# Patient Record
Sex: Female | Born: 1950
Health system: Southern US, Community
[De-identification: ages and names within clinical notes are randomized; demographics above are authoritative.]

## PROBLEM LIST (undated history)

## (undated) DIAGNOSIS — T7840XA Allergy, unspecified, initial encounter: Secondary | ICD-10-CM

## (undated) DIAGNOSIS — E7800 Pure hypercholesterolemia, unspecified: Secondary | ICD-10-CM

## (undated) DIAGNOSIS — R200 Anesthesia of skin: Secondary | ICD-10-CM

## (undated) DIAGNOSIS — M4800 Spinal stenosis, site unspecified: Secondary | ICD-10-CM

## (undated) DIAGNOSIS — I10 Essential (primary) hypertension: Secondary | ICD-10-CM

## (undated) DIAGNOSIS — R32 Unspecified urinary incontinence: Secondary | ICD-10-CM

## (undated) DIAGNOSIS — K5900 Constipation, unspecified: Secondary | ICD-10-CM

## (undated) DIAGNOSIS — L509 Urticaria, unspecified: Secondary | ICD-10-CM

## (undated) HISTORY — DX: Essential (primary) hypertension: I10

## (undated) HISTORY — PX: POSTERIOR LAMINECTOMY / DECOMPRESSION LUMBAR SPINE: SUR740

## (undated) HISTORY — DX: Anesthesia of skin: R20.0

## (undated) HISTORY — DX: Spinal stenosis, site unspecified: M48.00

## (undated) HISTORY — PX: OTHER SURGICAL HISTORY: SHX169

## (undated) HISTORY — DX: Unspecified urinary incontinence: R32

## (undated) HISTORY — DX: Constipation, unspecified: K59.00

## (undated) HISTORY — DX: Urticaria, unspecified: L50.9

## (undated) HISTORY — PX: GYNECOLOGIC CRYOSURGERY: SHX857

## (undated) HISTORY — DX: Pure hypercholesterolemia, unspecified: E78.00

## (undated) HISTORY — PX: CERVICAL FUSION: SHX112

## (undated) HISTORY — DX: Allergy, unspecified, initial encounter: T78.40XA

---

## 1979-03-11 HISTORY — PX: APPENDECTOMY: SHX54

## 1979-03-11 HISTORY — PX: TUBAL LIGATION: SHX77

## 1986-02-04 HISTORY — PX: BUNIONECTOMY: SHX129

## 1987-08-31 HISTORY — PX: ULNAR NERVE TRANSPOSITION: SHX2595

## 1989-08-13 HISTORY — PX: BREAST BIOPSY: SHX20

## 1998-10-05 HISTORY — PX: BREAST BIOPSY: SHX20

## 2006-01-24 HISTORY — PX: TOE SURGERY: SHX1073

## 2009-01-06 HISTORY — PX: CERVICAL FUSION: SHX112

## 2013-01-20 HISTORY — PX: UMBILICAL HERNIA REPAIR: SHX196

## 2014-12-14 HISTORY — PX: OTHER SURGICAL HISTORY: SHX169

## 2017-03-05 DIAGNOSIS — E785 Hyperlipidemia, unspecified: Secondary | ICD-10-CM | POA: Diagnosis not present

## 2017-03-05 DIAGNOSIS — E559 Vitamin D deficiency, unspecified: Secondary | ICD-10-CM | POA: Diagnosis not present

## 2017-03-05 DIAGNOSIS — M47819 Spondylosis without myelopathy or radiculopathy, site unspecified: Secondary | ICD-10-CM | POA: Diagnosis not present

## 2017-03-05 DIAGNOSIS — Z0001 Encounter for general adult medical examination with abnormal findings: Secondary | ICD-10-CM | POA: Diagnosis not present

## 2017-03-05 DIAGNOSIS — R5383 Other fatigue: Secondary | ICD-10-CM | POA: Diagnosis not present

## 2017-03-05 DIAGNOSIS — E2839 Other primary ovarian failure: Secondary | ICD-10-CM | POA: Diagnosis not present

## 2017-03-28 DIAGNOSIS — E2839 Other primary ovarian failure: Secondary | ICD-10-CM | POA: Diagnosis not present

## 2017-03-28 DIAGNOSIS — Z13820 Encounter for screening for osteoporosis: Secondary | ICD-10-CM | POA: Diagnosis not present

## 2017-04-30 DIAGNOSIS — R030 Elevated blood-pressure reading, without diagnosis of hypertension: Secondary | ICD-10-CM | POA: Diagnosis not present

## 2017-04-30 DIAGNOSIS — R946 Abnormal results of thyroid function studies: Secondary | ICD-10-CM | POA: Diagnosis not present

## 2017-04-30 DIAGNOSIS — R42 Dizziness and giddiness: Secondary | ICD-10-CM | POA: Diagnosis not present

## 2017-05-21 DIAGNOSIS — I10 Essential (primary) hypertension: Secondary | ICD-10-CM | POA: Diagnosis not present

## 2017-05-21 DIAGNOSIS — Z79899 Other long term (current) drug therapy: Secondary | ICD-10-CM | POA: Diagnosis not present

## 2017-09-20 DIAGNOSIS — R946 Abnormal results of thyroid function studies: Secondary | ICD-10-CM | POA: Diagnosis not present

## 2017-09-20 DIAGNOSIS — E785 Hyperlipidemia, unspecified: Secondary | ICD-10-CM | POA: Diagnosis not present

## 2017-09-20 DIAGNOSIS — J019 Acute sinusitis, unspecified: Secondary | ICD-10-CM | POA: Diagnosis not present

## 2017-09-20 DIAGNOSIS — J40 Bronchitis, not specified as acute or chronic: Secondary | ICD-10-CM | POA: Diagnosis not present

## 2017-09-20 DIAGNOSIS — Z6825 Body mass index (BMI) 25.0-25.9, adult: Secondary | ICD-10-CM | POA: Diagnosis not present

## 2017-09-20 DIAGNOSIS — I10 Essential (primary) hypertension: Secondary | ICD-10-CM | POA: Diagnosis not present

## 2017-09-20 DIAGNOSIS — Z79899 Other long term (current) drug therapy: Secondary | ICD-10-CM | POA: Diagnosis not present

## 2017-11-04 DIAGNOSIS — H524 Presbyopia: Secondary | ICD-10-CM | POA: Diagnosis not present

## 2017-11-04 DIAGNOSIS — H2513 Age-related nuclear cataract, bilateral: Secondary | ICD-10-CM | POA: Diagnosis not present

## 2017-11-18 DIAGNOSIS — R42 Dizziness and giddiness: Secondary | ICD-10-CM | POA: Diagnosis not present

## 2017-11-18 DIAGNOSIS — R0982 Postnasal drip: Secondary | ICD-10-CM | POA: Diagnosis not present

## 2017-12-12 DIAGNOSIS — L509 Urticaria, unspecified: Secondary | ICD-10-CM | POA: Diagnosis not present

## 2017-12-20 DIAGNOSIS — Z1231 Encounter for screening mammogram for malignant neoplasm of breast: Secondary | ICD-10-CM | POA: Diagnosis not present

## 2017-12-30 DIAGNOSIS — D2239 Melanocytic nevi of other parts of face: Secondary | ICD-10-CM | POA: Diagnosis not present

## 2017-12-30 DIAGNOSIS — L814 Other melanin hyperpigmentation: Secondary | ICD-10-CM | POA: Diagnosis not present

## 2017-12-30 DIAGNOSIS — D225 Melanocytic nevi of trunk: Secondary | ICD-10-CM | POA: Diagnosis not present

## 2017-12-30 DIAGNOSIS — L821 Other seborrheic keratosis: Secondary | ICD-10-CM | POA: Diagnosis not present

## 2017-12-30 DIAGNOSIS — L820 Inflamed seborrheic keratosis: Secondary | ICD-10-CM | POA: Diagnosis not present

## 2018-01-20 ENCOUNTER — Encounter: Payer: Self-pay | Admitting: Allergy and Immunology

## 2018-01-20 ENCOUNTER — Ambulatory Visit (INDEPENDENT_AMBULATORY_CARE_PROVIDER_SITE_OTHER): Payer: PPO | Admitting: Allergy and Immunology

## 2018-01-20 VITALS — BP 120/86 | HR 88 | Temp 98.6°F | Resp 16 | Ht 60.5 in | Wt 134.4 lb

## 2018-01-20 DIAGNOSIS — L501 Idiopathic urticaria: Secondary | ICD-10-CM

## 2018-01-20 DIAGNOSIS — T7840XA Allergy, unspecified, initial encounter: Secondary | ICD-10-CM

## 2018-01-20 NOTE — Progress Notes (Signed)
NEW PATIENT NOTE  Referring Provider: No ref. provider found Primary Provider: Ernestene Kiel, MD Date of office visit: 01/20/2018    Subjective:   Chief Complaint:  Beverly Wolfe (DOB: 1951-04-01) is a 67 y.o. female who presents to the clinic on 01/20/2018 with a chief complaint of Urticaria .  HPI: Stashia presents to this clinic in evaluation of a reaction that occurred on 11 December 2017.  Mazelle was attending an auction in March 2019 and had exposure to tobacco smell from cigar boxes that she purchased at the auction.  Later that night she started to develop elbow and knee redness and over the course of 24 hours or so she developed urticaria on the area of her knee and her elbow.  It was intensely itchy and she did excoriate this area.  It waxed and waned over the course of several days while using topical Benadryl and oral Benadryl and then she went to visit her primary care doctor who gave her a short course of prednisone and Zyrtec and Zantac and had her use hydrocortisone.  All that issue resolved within about 6 days total duration.  It has not been recurrent since.  She had no associated systemic or constitutional symptoms with this reactivity.  There was no healing with scar or hyperpigmentation on her skin.  There was not an obvious provoking factor giving rise to this issue.  She did have vertigo in February and took meclizine but her meclizine use discontinued around mid February.  She did have exposure to an indoor dog but that exposure was at least 1 week prior to the onset of her reaction.  Otherwise, her environment has not really changed significantly.  Ryanne did have an episode of anaphylaxis with unknown etiologic agent back in 2012 that fortunately has not been recurrent.  She did have an EpiPen back at that point in time but does not carry an EpiPen now.  She sometimes does have an issue with nasal itchiness and sneezing when getting around dog but no other significant  atopic symptomatology.  Past Medical History:  Diagnosis Date  . Allergic reaction   . High blood pressure   . Urticaria     Past Surgical History:  Procedure Laterality Date  . APPENDECTOMY  03/11/1979  . BREAST BIOPSY Left 08/13/1989  . BREAST BIOPSY Left 10/05/1998  . BUNIONECTOMY  02/04/1986  . CERVICAL FUSION  01/2009  . OTHER SURGICAL HISTORY  12/14/2014   Right arm radial nerve release  . TOE SURGERY Right 01/24/2006  . TUBAL LIGATION  03/11/1979  . ULNAR NERVE TRANSPOSITION Left 08/31/1987  . UMBILICAL HERNIA REPAIR  01/20/2013    Allergies as of 01/20/2018      Reactions   Cymbalta [duloxetine Hcl] Nausea Only, Other (See Comments)   Shakiness   Percocet [oxycodone-acetaminophen] Itching   Talwin [pentazocine] Nausea And Vomiting, Other (See Comments)   Hallucinations   Bactrim [sulfamethoxazole-trimethoprim] Itching, Rash   Sulfa Antibiotics Itching, Rash      Medication List      celecoxib 200 MG capsule Commonly known as:  CELEBREX Take 200 mg by mouth 2 (two) times daily.   gabapentin 300 MG capsule Commonly known as:  NEURONTIN Take 300 mg by mouth 2 (two) times daily.   GLUCOSAMINE-CHONDROITIN PO Take by mouth 2 (two) times daily.   HYDROCHLOROTHIAZIDE PO Take 12.5 mg by mouth 2 (two) times daily.   Magnesium 250 MG Tabs Take by mouth 2 (two) times daily.  omega-3 acid ethyl esters 1 g capsule Commonly known as:  LOVAZA   Vitamin D3 1000 units Caps Take by mouth daily.       Review of systems negative except as noted in HPI / PMHx or noted below:  Review of Systems  Constitutional: Negative.   HENT: Negative.   Eyes: Negative.   Respiratory: Negative.   Cardiovascular: Negative.   Gastrointestinal: Negative.   Genitourinary: Negative.   Musculoskeletal: Negative.   Skin: Negative.   Neurological: Negative.   Endo/Heme/Allergies: Negative.   Psychiatric/Behavioral: Negative.     Family History  Problem Relation Age of  Onset  . Allergic rhinitis Mother   . Cancer - Other Father        Oral Cancer  . COPD Sister   . Diabetes Maternal Aunt   . Diabetes Maternal Grandmother     Social History   Socioeconomic History  . Marital status: Single    Spouse name: Not on file  . Number of children: Not on file  . Years of education: Not on file  . Highest education level: Not on file  Occupational History  . Not on file  Social Needs  . Financial resource strain: Not on file  . Food insecurity:    Worry: Not on file    Inability: Not on file  . Transportation needs:    Medical: Not on file    Non-medical: Not on file  Tobacco Use  . Smoking status: Never Smoker  . Smokeless tobacco: Never Used  Substance and Sexual Activity  . Alcohol use: Yes  . Drug use: Never  . Sexual activity: Not on file  Lifestyle  . Physical activity:    Days per week: Not on file    Minutes per session: Not on file  . Stress: Not on file  Relationships  . Social connections:    Talks on phone: Not on file    Gets together: Not on file    Attends religious service: Not on file    Active member of club or organization: Not on file    Attends meetings of clubs or organizations: Not on file    Relationship status: Not on file  . Intimate partner violence:    Fear of current or ex partner: Not on file    Emotionally abused: Not on file    Physically abused: Not on file    Forced sexual activity: Not on file  Other Topics Concern  . Not on file  Social History Narrative  . Not on file    Environmental and Social history  Lives in a house with a dry environment, no animals located inside the household, cats and dogs located outside the household, hardwood in the bedroom floor, no plastic on the bed, no plastic on the pillow, no smokers located inside the household.  Objective:   Vitals:   01/20/18 1023  BP: 120/86  Pulse: 88  Resp: 16  Temp: 98.6 F (37 C)   Height: 5' 0.5" (153.7 cm) Weight: 134 lb  6.4 oz (61 kg)  Physical Exam  HENT:  Head: Normocephalic. Head is without right periorbital erythema and without left periorbital erythema.  Right Ear: Tympanic membrane, external ear and ear canal normal.  Left Ear: Tympanic membrane, external ear and ear canal normal.  Nose: Nose normal. No mucosal edema or rhinorrhea.  Mouth/Throat: Uvula is midline, oropharynx is clear and moist and mucous membranes are normal. No oropharyngeal exudate.  Eyes: Pupils are equal, round,  and reactive to light. Conjunctivae and lids are normal.  Neck: Trachea normal. No tracheal tenderness present. No tracheal deviation present. No thyromegaly present.  Cardiovascular: Normal rate, regular rhythm, S1 normal, S2 normal and normal heart sounds.  No murmur heard. Pulmonary/Chest: Effort normal and breath sounds normal. No stridor. No respiratory distress. She has no wheezes. She has no rales. She exhibits no tenderness.  Abdominal: Soft. She exhibits no distension and no mass. There is no hepatosplenomegaly. There is no tenderness. There is no rebound and no guarding.  Musculoskeletal: She exhibits no edema or tenderness.  Lymphadenopathy:       Head (right side): No tonsillar adenopathy present.       Head (left side): No tonsillar adenopathy present.    She has no cervical adenopathy.    She has no axillary adenopathy.  Neurological: She is alert.  Skin: Rash (Very slight erythema of posterior elbow.) noted. She is not diaphoretic. No erythema. No pallor. Nails show no clubbing.    Diagnostics: none   Assessment and Plan:    1. Allergic reaction, initial encounter   2. Idiopathic urticaria    Daesha had an isolated episode of immunological hyperreactivity with unknown etiologic factor that fortunately has resolved.  I have informed Briana that she should contact me should she have recurrent reactions in the future.  At this point we will hold off on any evaluation for the etiologic agent contributing to  her episode of immunological hyperreactivity as it does appear to have completely resolved.  If she has recurrent episodes in the future we will need to have her undergo further evaluation looking for the etiologic agent responsible for these episodes of immunological hyperreactivity.  Allena Katz, MD Allergy / Immunology [place]

## 2018-01-21 ENCOUNTER — Encounter: Payer: Self-pay | Admitting: Allergy and Immunology

## 2018-03-21 DIAGNOSIS — E785 Hyperlipidemia, unspecified: Secondary | ICD-10-CM | POA: Diagnosis not present

## 2018-03-21 DIAGNOSIS — R42 Dizziness and giddiness: Secondary | ICD-10-CM | POA: Diagnosis not present

## 2018-03-21 DIAGNOSIS — I10 Essential (primary) hypertension: Secondary | ICD-10-CM | POA: Diagnosis not present

## 2018-03-21 DIAGNOSIS — Z79899 Other long term (current) drug therapy: Secondary | ICD-10-CM | POA: Diagnosis not present

## 2018-03-21 DIAGNOSIS — M5030 Other cervical disc degeneration, unspecified cervical region: Secondary | ICD-10-CM | POA: Diagnosis not present

## 2018-03-21 DIAGNOSIS — R946 Abnormal results of thyroid function studies: Secondary | ICD-10-CM | POA: Diagnosis not present

## 2018-05-07 DIAGNOSIS — M1712 Unilateral primary osteoarthritis, left knee: Secondary | ICD-10-CM | POA: Diagnosis not present

## 2018-05-07 DIAGNOSIS — M25562 Pain in left knee: Secondary | ICD-10-CM | POA: Diagnosis not present

## 2018-05-28 DIAGNOSIS — L820 Inflamed seborrheic keratosis: Secondary | ICD-10-CM | POA: Diagnosis not present

## 2018-05-28 DIAGNOSIS — L814 Other melanin hyperpigmentation: Secondary | ICD-10-CM | POA: Diagnosis not present

## 2018-09-02 DIAGNOSIS — L0100 Impetigo, unspecified: Secondary | ICD-10-CM | POA: Diagnosis not present

## 2018-09-05 DIAGNOSIS — B029 Zoster without complications: Secondary | ICD-10-CM | POA: Diagnosis not present

## 2018-09-08 DIAGNOSIS — B029 Zoster without complications: Secondary | ICD-10-CM | POA: Diagnosis not present

## 2018-09-22 DIAGNOSIS — I10 Essential (primary) hypertension: Secondary | ICD-10-CM | POA: Diagnosis not present

## 2018-09-22 DIAGNOSIS — Z0000 Encounter for general adult medical examination without abnormal findings: Secondary | ICD-10-CM | POA: Diagnosis not present

## 2018-09-22 DIAGNOSIS — Z23 Encounter for immunization: Secondary | ICD-10-CM | POA: Diagnosis not present

## 2018-09-22 DIAGNOSIS — Z1331 Encounter for screening for depression: Secondary | ICD-10-CM | POA: Diagnosis not present

## 2018-09-22 DIAGNOSIS — Z79899 Other long term (current) drug therapy: Secondary | ICD-10-CM | POA: Diagnosis not present

## 2018-09-22 DIAGNOSIS — E785 Hyperlipidemia, unspecified: Secondary | ICD-10-CM | POA: Diagnosis not present

## 2018-09-22 DIAGNOSIS — Z6825 Body mass index (BMI) 25.0-25.9, adult: Secondary | ICD-10-CM | POA: Diagnosis not present

## 2018-09-22 DIAGNOSIS — Z1231 Encounter for screening mammogram for malignant neoplasm of breast: Secondary | ICD-10-CM | POA: Diagnosis not present

## 2018-09-22 DIAGNOSIS — Z1339 Encounter for screening examination for other mental health and behavioral disorders: Secondary | ICD-10-CM | POA: Diagnosis not present

## 2018-10-07 DIAGNOSIS — R200 Anesthesia of skin: Secondary | ICD-10-CM | POA: Diagnosis not present

## 2018-10-07 DIAGNOSIS — Z6826 Body mass index (BMI) 26.0-26.9, adult: Secondary | ICD-10-CM | POA: Diagnosis not present

## 2018-10-07 DIAGNOSIS — E663 Overweight: Secondary | ICD-10-CM | POA: Diagnosis not present

## 2018-10-24 DIAGNOSIS — I6782 Cerebral ischemia: Secondary | ICD-10-CM | POA: Diagnosis not present

## 2018-10-24 DIAGNOSIS — R42 Dizziness and giddiness: Secondary | ICD-10-CM | POA: Diagnosis not present

## 2018-10-24 DIAGNOSIS — R200 Anesthesia of skin: Secondary | ICD-10-CM | POA: Diagnosis not present

## 2018-11-27 DIAGNOSIS — Z6825 Body mass index (BMI) 25.0-25.9, adult: Secondary | ICD-10-CM | POA: Diagnosis not present

## 2018-11-27 DIAGNOSIS — R209 Unspecified disturbances of skin sensation: Secondary | ICD-10-CM | POA: Diagnosis not present

## 2018-11-27 DIAGNOSIS — I679 Cerebrovascular disease, unspecified: Secondary | ICD-10-CM | POA: Diagnosis not present

## 2018-11-27 DIAGNOSIS — M5030 Other cervical disc degeneration, unspecified cervical region: Secondary | ICD-10-CM | POA: Diagnosis not present

## 2018-12-01 DIAGNOSIS — H05231 Hemorrhage of right orbit: Secondary | ICD-10-CM | POA: Diagnosis not present

## 2019-03-04 DIAGNOSIS — Z79899 Other long term (current) drug therapy: Secondary | ICD-10-CM | POA: Diagnosis not present

## 2019-03-04 DIAGNOSIS — G8929 Other chronic pain: Secondary | ICD-10-CM | POA: Diagnosis not present

## 2019-03-04 DIAGNOSIS — M25511 Pain in right shoulder: Secondary | ICD-10-CM | POA: Diagnosis not present

## 2019-03-04 DIAGNOSIS — R29898 Other symptoms and signs involving the musculoskeletal system: Secondary | ICD-10-CM | POA: Diagnosis not present

## 2019-03-04 DIAGNOSIS — M25512 Pain in left shoulder: Secondary | ICD-10-CM | POA: Diagnosis not present

## 2019-03-04 DIAGNOSIS — Z6836 Body mass index (BMI) 36.0-36.9, adult: Secondary | ICD-10-CM | POA: Diagnosis not present

## 2019-03-09 DIAGNOSIS — M5021 Other cervical disc displacement,  high cervical region: Secondary | ICD-10-CM | POA: Diagnosis not present

## 2019-03-09 DIAGNOSIS — M5030 Other cervical disc degeneration, unspecified cervical region: Secondary | ICD-10-CM | POA: Diagnosis not present

## 2019-03-09 DIAGNOSIS — M4803 Spinal stenosis, cervicothoracic region: Secondary | ICD-10-CM | POA: Diagnosis not present

## 2019-03-09 DIAGNOSIS — M47812 Spondylosis without myelopathy or radiculopathy, cervical region: Secondary | ICD-10-CM | POA: Diagnosis not present

## 2019-03-09 DIAGNOSIS — M4802 Spinal stenosis, cervical region: Secondary | ICD-10-CM | POA: Diagnosis not present

## 2019-03-16 DIAGNOSIS — M9901 Segmental and somatic dysfunction of cervical region: Secondary | ICD-10-CM | POA: Diagnosis not present

## 2019-03-16 DIAGNOSIS — M546 Pain in thoracic spine: Secondary | ICD-10-CM | POA: Diagnosis not present

## 2019-03-16 DIAGNOSIS — M4723 Other spondylosis with radiculopathy, cervicothoracic region: Secondary | ICD-10-CM | POA: Diagnosis not present

## 2019-03-16 DIAGNOSIS — M9903 Segmental and somatic dysfunction of lumbar region: Secondary | ICD-10-CM | POA: Diagnosis not present

## 2019-03-16 DIAGNOSIS — M9902 Segmental and somatic dysfunction of thoracic region: Secondary | ICD-10-CM | POA: Diagnosis not present

## 2019-03-18 DIAGNOSIS — M4723 Other spondylosis with radiculopathy, cervicothoracic region: Secondary | ICD-10-CM | POA: Diagnosis not present

## 2019-03-18 DIAGNOSIS — M546 Pain in thoracic spine: Secondary | ICD-10-CM | POA: Diagnosis not present

## 2019-03-18 DIAGNOSIS — M9902 Segmental and somatic dysfunction of thoracic region: Secondary | ICD-10-CM | POA: Diagnosis not present

## 2019-03-18 DIAGNOSIS — M9903 Segmental and somatic dysfunction of lumbar region: Secondary | ICD-10-CM | POA: Diagnosis not present

## 2019-03-18 DIAGNOSIS — M9901 Segmental and somatic dysfunction of cervical region: Secondary | ICD-10-CM | POA: Diagnosis not present

## 2019-03-23 DIAGNOSIS — M9902 Segmental and somatic dysfunction of thoracic region: Secondary | ICD-10-CM | POA: Diagnosis not present

## 2019-03-23 DIAGNOSIS — M546 Pain in thoracic spine: Secondary | ICD-10-CM | POA: Diagnosis not present

## 2019-03-23 DIAGNOSIS — M9901 Segmental and somatic dysfunction of cervical region: Secondary | ICD-10-CM | POA: Diagnosis not present

## 2019-03-23 DIAGNOSIS — M4723 Other spondylosis with radiculopathy, cervicothoracic region: Secondary | ICD-10-CM | POA: Diagnosis not present

## 2019-03-23 DIAGNOSIS — M9903 Segmental and somatic dysfunction of lumbar region: Secondary | ICD-10-CM | POA: Diagnosis not present

## 2019-03-25 DIAGNOSIS — M4723 Other spondylosis with radiculopathy, cervicothoracic region: Secondary | ICD-10-CM | POA: Diagnosis not present

## 2019-03-25 DIAGNOSIS — M546 Pain in thoracic spine: Secondary | ICD-10-CM | POA: Diagnosis not present

## 2019-03-25 DIAGNOSIS — M9901 Segmental and somatic dysfunction of cervical region: Secondary | ICD-10-CM | POA: Diagnosis not present

## 2019-03-25 DIAGNOSIS — M9903 Segmental and somatic dysfunction of lumbar region: Secondary | ICD-10-CM | POA: Diagnosis not present

## 2019-03-25 DIAGNOSIS — M9902 Segmental and somatic dysfunction of thoracic region: Secondary | ICD-10-CM | POA: Diagnosis not present

## 2019-03-30 DIAGNOSIS — Z1231 Encounter for screening mammogram for malignant neoplasm of breast: Secondary | ICD-10-CM | POA: Diagnosis not present

## 2019-03-30 DIAGNOSIS — I10 Essential (primary) hypertension: Secondary | ICD-10-CM | POA: Diagnosis not present

## 2019-03-30 DIAGNOSIS — M9902 Segmental and somatic dysfunction of thoracic region: Secondary | ICD-10-CM | POA: Diagnosis not present

## 2019-03-30 DIAGNOSIS — Z124 Encounter for screening for malignant neoplasm of cervix: Secondary | ICD-10-CM | POA: Diagnosis not present

## 2019-03-30 DIAGNOSIS — E785 Hyperlipidemia, unspecified: Secondary | ICD-10-CM | POA: Diagnosis not present

## 2019-03-30 DIAGNOSIS — M5030 Other cervical disc degeneration, unspecified cervical region: Secondary | ICD-10-CM | POA: Diagnosis not present

## 2019-03-30 DIAGNOSIS — M546 Pain in thoracic spine: Secondary | ICD-10-CM | POA: Diagnosis not present

## 2019-03-30 DIAGNOSIS — M9903 Segmental and somatic dysfunction of lumbar region: Secondary | ICD-10-CM | POA: Diagnosis not present

## 2019-03-30 DIAGNOSIS — Z1331 Encounter for screening for depression: Secondary | ICD-10-CM | POA: Diagnosis not present

## 2019-03-30 DIAGNOSIS — M9901 Segmental and somatic dysfunction of cervical region: Secondary | ICD-10-CM | POA: Diagnosis not present

## 2019-03-30 DIAGNOSIS — Z0001 Encounter for general adult medical examination with abnormal findings: Secondary | ICD-10-CM | POA: Diagnosis not present

## 2019-03-30 DIAGNOSIS — M4723 Other spondylosis with radiculopathy, cervicothoracic region: Secondary | ICD-10-CM | POA: Diagnosis not present

## 2019-03-30 DIAGNOSIS — Z79899 Other long term (current) drug therapy: Secondary | ICD-10-CM | POA: Diagnosis not present

## 2019-03-30 DIAGNOSIS — Z6825 Body mass index (BMI) 25.0-25.9, adult: Secondary | ICD-10-CM | POA: Diagnosis not present

## 2019-03-30 DIAGNOSIS — I679 Cerebrovascular disease, unspecified: Secondary | ICD-10-CM | POA: Diagnosis not present

## 2019-03-30 DIAGNOSIS — Z79891 Long term (current) use of opiate analgesic: Secondary | ICD-10-CM | POA: Diagnosis not present

## 2019-04-02 DIAGNOSIS — M9903 Segmental and somatic dysfunction of lumbar region: Secondary | ICD-10-CM | POA: Diagnosis not present

## 2019-04-02 DIAGNOSIS — M546 Pain in thoracic spine: Secondary | ICD-10-CM | POA: Diagnosis not present

## 2019-04-02 DIAGNOSIS — M9902 Segmental and somatic dysfunction of thoracic region: Secondary | ICD-10-CM | POA: Diagnosis not present

## 2019-04-02 DIAGNOSIS — M4723 Other spondylosis with radiculopathy, cervicothoracic region: Secondary | ICD-10-CM | POA: Diagnosis not present

## 2019-04-02 DIAGNOSIS — M9901 Segmental and somatic dysfunction of cervical region: Secondary | ICD-10-CM | POA: Diagnosis not present

## 2019-04-06 DIAGNOSIS — M9902 Segmental and somatic dysfunction of thoracic region: Secondary | ICD-10-CM | POA: Diagnosis not present

## 2019-04-06 DIAGNOSIS — M9901 Segmental and somatic dysfunction of cervical region: Secondary | ICD-10-CM | POA: Diagnosis not present

## 2019-04-06 DIAGNOSIS — M9903 Segmental and somatic dysfunction of lumbar region: Secondary | ICD-10-CM | POA: Diagnosis not present

## 2019-04-06 DIAGNOSIS — M546 Pain in thoracic spine: Secondary | ICD-10-CM | POA: Diagnosis not present

## 2019-04-06 DIAGNOSIS — M4723 Other spondylosis with radiculopathy, cervicothoracic region: Secondary | ICD-10-CM | POA: Diagnosis not present

## 2019-04-09 DIAGNOSIS — M546 Pain in thoracic spine: Secondary | ICD-10-CM | POA: Diagnosis not present

## 2019-04-09 DIAGNOSIS — M9902 Segmental and somatic dysfunction of thoracic region: Secondary | ICD-10-CM | POA: Diagnosis not present

## 2019-04-09 DIAGNOSIS — M9901 Segmental and somatic dysfunction of cervical region: Secondary | ICD-10-CM | POA: Diagnosis not present

## 2019-04-09 DIAGNOSIS — M4723 Other spondylosis with radiculopathy, cervicothoracic region: Secondary | ICD-10-CM | POA: Diagnosis not present

## 2019-04-09 DIAGNOSIS — M9903 Segmental and somatic dysfunction of lumbar region: Secondary | ICD-10-CM | POA: Diagnosis not present

## 2019-04-14 DIAGNOSIS — M546 Pain in thoracic spine: Secondary | ICD-10-CM | POA: Diagnosis not present

## 2019-04-14 DIAGNOSIS — M9903 Segmental and somatic dysfunction of lumbar region: Secondary | ICD-10-CM | POA: Diagnosis not present

## 2019-04-14 DIAGNOSIS — M9902 Segmental and somatic dysfunction of thoracic region: Secondary | ICD-10-CM | POA: Diagnosis not present

## 2019-04-14 DIAGNOSIS — M4723 Other spondylosis with radiculopathy, cervicothoracic region: Secondary | ICD-10-CM | POA: Diagnosis not present

## 2019-04-14 DIAGNOSIS — M9901 Segmental and somatic dysfunction of cervical region: Secondary | ICD-10-CM | POA: Diagnosis not present

## 2019-04-16 DIAGNOSIS — M5412 Radiculopathy, cervical region: Secondary | ICD-10-CM | POA: Diagnosis not present

## 2019-04-21 DIAGNOSIS — M9901 Segmental and somatic dysfunction of cervical region: Secondary | ICD-10-CM | POA: Diagnosis not present

## 2019-04-21 DIAGNOSIS — M546 Pain in thoracic spine: Secondary | ICD-10-CM | POA: Diagnosis not present

## 2019-04-21 DIAGNOSIS — M4723 Other spondylosis with radiculopathy, cervicothoracic region: Secondary | ICD-10-CM | POA: Diagnosis not present

## 2019-04-21 DIAGNOSIS — M9902 Segmental and somatic dysfunction of thoracic region: Secondary | ICD-10-CM | POA: Diagnosis not present

## 2019-04-21 DIAGNOSIS — M9903 Segmental and somatic dysfunction of lumbar region: Secondary | ICD-10-CM | POA: Diagnosis not present

## 2019-04-28 DIAGNOSIS — M9901 Segmental and somatic dysfunction of cervical region: Secondary | ICD-10-CM | POA: Diagnosis not present

## 2019-04-28 DIAGNOSIS — M546 Pain in thoracic spine: Secondary | ICD-10-CM | POA: Diagnosis not present

## 2019-04-28 DIAGNOSIS — M9902 Segmental and somatic dysfunction of thoracic region: Secondary | ICD-10-CM | POA: Diagnosis not present

## 2019-04-28 DIAGNOSIS — M9903 Segmental and somatic dysfunction of lumbar region: Secondary | ICD-10-CM | POA: Diagnosis not present

## 2019-04-28 DIAGNOSIS — M4723 Other spondylosis with radiculopathy, cervicothoracic region: Secondary | ICD-10-CM | POA: Diagnosis not present

## 2019-05-01 DIAGNOSIS — M5412 Radiculopathy, cervical region: Secondary | ICD-10-CM | POA: Diagnosis not present

## 2019-05-01 DIAGNOSIS — M5013 Cervical disc disorder with radiculopathy, cervicothoracic region: Secondary | ICD-10-CM | POA: Diagnosis not present

## 2019-05-06 DIAGNOSIS — M9902 Segmental and somatic dysfunction of thoracic region: Secondary | ICD-10-CM | POA: Diagnosis not present

## 2019-05-06 DIAGNOSIS — M4723 Other spondylosis with radiculopathy, cervicothoracic region: Secondary | ICD-10-CM | POA: Diagnosis not present

## 2019-05-06 DIAGNOSIS — M9903 Segmental and somatic dysfunction of lumbar region: Secondary | ICD-10-CM | POA: Diagnosis not present

## 2019-05-06 DIAGNOSIS — M546 Pain in thoracic spine: Secondary | ICD-10-CM | POA: Diagnosis not present

## 2019-05-06 DIAGNOSIS — M9901 Segmental and somatic dysfunction of cervical region: Secondary | ICD-10-CM | POA: Diagnosis not present

## 2019-05-13 DIAGNOSIS — M546 Pain in thoracic spine: Secondary | ICD-10-CM | POA: Diagnosis not present

## 2019-05-13 DIAGNOSIS — M9902 Segmental and somatic dysfunction of thoracic region: Secondary | ICD-10-CM | POA: Diagnosis not present

## 2019-05-13 DIAGNOSIS — M9903 Segmental and somatic dysfunction of lumbar region: Secondary | ICD-10-CM | POA: Diagnosis not present

## 2019-05-13 DIAGNOSIS — M9901 Segmental and somatic dysfunction of cervical region: Secondary | ICD-10-CM | POA: Diagnosis not present

## 2019-05-13 DIAGNOSIS — M4723 Other spondylosis with radiculopathy, cervicothoracic region: Secondary | ICD-10-CM | POA: Diagnosis not present

## 2019-05-19 DIAGNOSIS — M4723 Other spondylosis with radiculopathy, cervicothoracic region: Secondary | ICD-10-CM | POA: Diagnosis not present

## 2019-05-19 DIAGNOSIS — M9901 Segmental and somatic dysfunction of cervical region: Secondary | ICD-10-CM | POA: Diagnosis not present

## 2019-05-19 DIAGNOSIS — M546 Pain in thoracic spine: Secondary | ICD-10-CM | POA: Diagnosis not present

## 2019-05-19 DIAGNOSIS — M9902 Segmental and somatic dysfunction of thoracic region: Secondary | ICD-10-CM | POA: Diagnosis not present

## 2019-05-19 DIAGNOSIS — M9903 Segmental and somatic dysfunction of lumbar region: Secondary | ICD-10-CM | POA: Diagnosis not present

## 2019-05-26 DIAGNOSIS — M4723 Other spondylosis with radiculopathy, cervicothoracic region: Secondary | ICD-10-CM | POA: Diagnosis not present

## 2019-05-26 DIAGNOSIS — M9903 Segmental and somatic dysfunction of lumbar region: Secondary | ICD-10-CM | POA: Diagnosis not present

## 2019-05-26 DIAGNOSIS — M9902 Segmental and somatic dysfunction of thoracic region: Secondary | ICD-10-CM | POA: Diagnosis not present

## 2019-05-26 DIAGNOSIS — M9901 Segmental and somatic dysfunction of cervical region: Secondary | ICD-10-CM | POA: Diagnosis not present

## 2019-05-26 DIAGNOSIS — M546 Pain in thoracic spine: Secondary | ICD-10-CM | POA: Diagnosis not present

## 2019-05-29 DIAGNOSIS — M5412 Radiculopathy, cervical region: Secondary | ICD-10-CM | POA: Diagnosis not present

## 2019-06-02 DIAGNOSIS — M9901 Segmental and somatic dysfunction of cervical region: Secondary | ICD-10-CM | POA: Diagnosis not present

## 2019-06-02 DIAGNOSIS — M9902 Segmental and somatic dysfunction of thoracic region: Secondary | ICD-10-CM | POA: Diagnosis not present

## 2019-06-02 DIAGNOSIS — M4723 Other spondylosis with radiculopathy, cervicothoracic region: Secondary | ICD-10-CM | POA: Diagnosis not present

## 2019-06-02 DIAGNOSIS — M9903 Segmental and somatic dysfunction of lumbar region: Secondary | ICD-10-CM | POA: Diagnosis not present

## 2019-06-02 DIAGNOSIS — M546 Pain in thoracic spine: Secondary | ICD-10-CM | POA: Diagnosis not present

## 2019-06-09 DIAGNOSIS — M9903 Segmental and somatic dysfunction of lumbar region: Secondary | ICD-10-CM | POA: Diagnosis not present

## 2019-06-09 DIAGNOSIS — M4723 Other spondylosis with radiculopathy, cervicothoracic region: Secondary | ICD-10-CM | POA: Diagnosis not present

## 2019-06-09 DIAGNOSIS — M546 Pain in thoracic spine: Secondary | ICD-10-CM | POA: Diagnosis not present

## 2019-06-09 DIAGNOSIS — M9902 Segmental and somatic dysfunction of thoracic region: Secondary | ICD-10-CM | POA: Diagnosis not present

## 2019-06-09 DIAGNOSIS — M9901 Segmental and somatic dysfunction of cervical region: Secondary | ICD-10-CM | POA: Diagnosis not present

## 2019-06-16 DIAGNOSIS — M9903 Segmental and somatic dysfunction of lumbar region: Secondary | ICD-10-CM | POA: Diagnosis not present

## 2019-06-16 DIAGNOSIS — M9901 Segmental and somatic dysfunction of cervical region: Secondary | ICD-10-CM | POA: Diagnosis not present

## 2019-06-16 DIAGNOSIS — M546 Pain in thoracic spine: Secondary | ICD-10-CM | POA: Diagnosis not present

## 2019-06-16 DIAGNOSIS — M9902 Segmental and somatic dysfunction of thoracic region: Secondary | ICD-10-CM | POA: Diagnosis not present

## 2019-06-16 DIAGNOSIS — M4723 Other spondylosis with radiculopathy, cervicothoracic region: Secondary | ICD-10-CM | POA: Diagnosis not present

## 2019-06-23 DIAGNOSIS — M9901 Segmental and somatic dysfunction of cervical region: Secondary | ICD-10-CM | POA: Diagnosis not present

## 2019-06-23 DIAGNOSIS — M9902 Segmental and somatic dysfunction of thoracic region: Secondary | ICD-10-CM | POA: Diagnosis not present

## 2019-06-23 DIAGNOSIS — M4723 Other spondylosis with radiculopathy, cervicothoracic region: Secondary | ICD-10-CM | POA: Diagnosis not present

## 2019-06-23 DIAGNOSIS — M9903 Segmental and somatic dysfunction of lumbar region: Secondary | ICD-10-CM | POA: Diagnosis not present

## 2019-06-23 DIAGNOSIS — M546 Pain in thoracic spine: Secondary | ICD-10-CM | POA: Diagnosis not present

## 2019-06-29 DIAGNOSIS — M5030 Other cervical disc degeneration, unspecified cervical region: Secondary | ICD-10-CM | POA: Diagnosis not present

## 2019-06-29 DIAGNOSIS — Z6825 Body mass index (BMI) 25.0-25.9, adult: Secondary | ICD-10-CM | POA: Diagnosis not present

## 2019-06-29 DIAGNOSIS — I10 Essential (primary) hypertension: Secondary | ICD-10-CM | POA: Diagnosis not present

## 2019-06-29 DIAGNOSIS — Z1331 Encounter for screening for depression: Secondary | ICD-10-CM | POA: Diagnosis not present

## 2019-06-29 DIAGNOSIS — E785 Hyperlipidemia, unspecified: Secondary | ICD-10-CM | POA: Diagnosis not present

## 2019-06-29 DIAGNOSIS — Z79899 Other long term (current) drug therapy: Secondary | ICD-10-CM | POA: Diagnosis not present

## 2019-06-30 DIAGNOSIS — M9901 Segmental and somatic dysfunction of cervical region: Secondary | ICD-10-CM | POA: Diagnosis not present

## 2019-06-30 DIAGNOSIS — M546 Pain in thoracic spine: Secondary | ICD-10-CM | POA: Diagnosis not present

## 2019-06-30 DIAGNOSIS — M9903 Segmental and somatic dysfunction of lumbar region: Secondary | ICD-10-CM | POA: Diagnosis not present

## 2019-06-30 DIAGNOSIS — M9902 Segmental and somatic dysfunction of thoracic region: Secondary | ICD-10-CM | POA: Diagnosis not present

## 2019-06-30 DIAGNOSIS — H04123 Dry eye syndrome of bilateral lacrimal glands: Secondary | ICD-10-CM | POA: Diagnosis not present

## 2019-06-30 DIAGNOSIS — M4723 Other spondylosis with radiculopathy, cervicothoracic region: Secondary | ICD-10-CM | POA: Diagnosis not present

## 2019-06-30 DIAGNOSIS — H05231 Hemorrhage of right orbit: Secondary | ICD-10-CM | POA: Diagnosis not present

## 2019-07-07 DIAGNOSIS — M9903 Segmental and somatic dysfunction of lumbar region: Secondary | ICD-10-CM | POA: Diagnosis not present

## 2019-07-07 DIAGNOSIS — M4723 Other spondylosis with radiculopathy, cervicothoracic region: Secondary | ICD-10-CM | POA: Diagnosis not present

## 2019-07-07 DIAGNOSIS — M9902 Segmental and somatic dysfunction of thoracic region: Secondary | ICD-10-CM | POA: Diagnosis not present

## 2019-07-07 DIAGNOSIS — M546 Pain in thoracic spine: Secondary | ICD-10-CM | POA: Diagnosis not present

## 2019-07-07 DIAGNOSIS — M9901 Segmental and somatic dysfunction of cervical region: Secondary | ICD-10-CM | POA: Diagnosis not present

## 2019-07-14 DIAGNOSIS — M4723 Other spondylosis with radiculopathy, cervicothoracic region: Secondary | ICD-10-CM | POA: Diagnosis not present

## 2019-07-14 DIAGNOSIS — M546 Pain in thoracic spine: Secondary | ICD-10-CM | POA: Diagnosis not present

## 2019-07-14 DIAGNOSIS — M9901 Segmental and somatic dysfunction of cervical region: Secondary | ICD-10-CM | POA: Diagnosis not present

## 2019-07-14 DIAGNOSIS — M9902 Segmental and somatic dysfunction of thoracic region: Secondary | ICD-10-CM | POA: Diagnosis not present

## 2019-07-14 DIAGNOSIS — M9903 Segmental and somatic dysfunction of lumbar region: Secondary | ICD-10-CM | POA: Diagnosis not present

## 2019-07-17 DIAGNOSIS — Z1231 Encounter for screening mammogram for malignant neoplasm of breast: Secondary | ICD-10-CM | POA: Diagnosis not present

## 2019-07-21 DIAGNOSIS — M542 Cervicalgia: Secondary | ICD-10-CM | POA: Diagnosis not present

## 2019-07-21 DIAGNOSIS — M4802 Spinal stenosis, cervical region: Secondary | ICD-10-CM | POA: Diagnosis not present

## 2019-07-24 DIAGNOSIS — I10 Essential (primary) hypertension: Secondary | ICD-10-CM | POA: Diagnosis not present

## 2019-07-24 DIAGNOSIS — Z6825 Body mass index (BMI) 25.0-25.9, adult: Secondary | ICD-10-CM | POA: Diagnosis not present

## 2019-07-24 DIAGNOSIS — M4712 Other spondylosis with myelopathy, cervical region: Secondary | ICD-10-CM | POA: Diagnosis not present

## 2019-07-28 DIAGNOSIS — M9903 Segmental and somatic dysfunction of lumbar region: Secondary | ICD-10-CM | POA: Diagnosis not present

## 2019-07-28 DIAGNOSIS — M4723 Other spondylosis with radiculopathy, cervicothoracic region: Secondary | ICD-10-CM | POA: Diagnosis not present

## 2019-07-28 DIAGNOSIS — M9902 Segmental and somatic dysfunction of thoracic region: Secondary | ICD-10-CM | POA: Diagnosis not present

## 2019-07-28 DIAGNOSIS — M9901 Segmental and somatic dysfunction of cervical region: Secondary | ICD-10-CM | POA: Diagnosis not present

## 2019-07-28 DIAGNOSIS — M546 Pain in thoracic spine: Secondary | ICD-10-CM | POA: Diagnosis not present

## 2019-07-29 DIAGNOSIS — E559 Vitamin D deficiency, unspecified: Secondary | ICD-10-CM | POA: Diagnosis not present

## 2019-07-29 DIAGNOSIS — Z79899 Other long term (current) drug therapy: Secondary | ICD-10-CM | POA: Diagnosis not present

## 2019-07-29 DIAGNOSIS — Z01818 Encounter for other preprocedural examination: Secondary | ICD-10-CM | POA: Diagnosis not present

## 2019-07-29 DIAGNOSIS — M79609 Pain in unspecified limb: Secondary | ICD-10-CM | POA: Diagnosis not present

## 2019-07-29 DIAGNOSIS — R52 Pain, unspecified: Secondary | ICD-10-CM | POA: Diagnosis not present

## 2019-07-30 DIAGNOSIS — M4322 Fusion of spine, cervical region: Secondary | ICD-10-CM | POA: Diagnosis not present

## 2019-07-30 DIAGNOSIS — R52 Pain, unspecified: Secondary | ICD-10-CM | POA: Diagnosis not present

## 2019-07-30 DIAGNOSIS — Z01818 Encounter for other preprocedural examination: Secondary | ICD-10-CM | POA: Diagnosis not present

## 2019-07-30 DIAGNOSIS — M79603 Pain in arm, unspecified: Secondary | ICD-10-CM | POA: Diagnosis not present

## 2019-07-30 DIAGNOSIS — E559 Vitamin D deficiency, unspecified: Secondary | ICD-10-CM | POA: Diagnosis not present

## 2019-07-30 DIAGNOSIS — Z79899 Other long term (current) drug therapy: Secondary | ICD-10-CM | POA: Diagnosis not present

## 2019-08-04 DIAGNOSIS — M9902 Segmental and somatic dysfunction of thoracic region: Secondary | ICD-10-CM | POA: Diagnosis not present

## 2019-08-04 DIAGNOSIS — M9901 Segmental and somatic dysfunction of cervical region: Secondary | ICD-10-CM | POA: Diagnosis not present

## 2019-08-04 DIAGNOSIS — M9903 Segmental and somatic dysfunction of lumbar region: Secondary | ICD-10-CM | POA: Diagnosis not present

## 2019-08-04 DIAGNOSIS — M546 Pain in thoracic spine: Secondary | ICD-10-CM | POA: Diagnosis not present

## 2019-08-04 DIAGNOSIS — M4723 Other spondylosis with radiculopathy, cervicothoracic region: Secondary | ICD-10-CM | POA: Diagnosis not present

## 2019-08-11 DIAGNOSIS — R9431 Abnormal electrocardiogram [ECG] [EKG]: Secondary | ICD-10-CM | POA: Diagnosis not present

## 2019-08-11 DIAGNOSIS — M9901 Segmental and somatic dysfunction of cervical region: Secondary | ICD-10-CM | POA: Diagnosis not present

## 2019-08-11 DIAGNOSIS — M4723 Other spondylosis with radiculopathy, cervicothoracic region: Secondary | ICD-10-CM | POA: Diagnosis not present

## 2019-08-11 DIAGNOSIS — M9903 Segmental and somatic dysfunction of lumbar region: Secondary | ICD-10-CM | POA: Diagnosis not present

## 2019-08-11 DIAGNOSIS — Z6825 Body mass index (BMI) 25.0-25.9, adult: Secondary | ICD-10-CM | POA: Diagnosis not present

## 2019-08-11 DIAGNOSIS — I10 Essential (primary) hypertension: Secondary | ICD-10-CM | POA: Diagnosis not present

## 2019-08-11 DIAGNOSIS — M9902 Segmental and somatic dysfunction of thoracic region: Secondary | ICD-10-CM | POA: Diagnosis not present

## 2019-08-11 DIAGNOSIS — M546 Pain in thoracic spine: Secondary | ICD-10-CM | POA: Diagnosis not present

## 2019-08-11 DIAGNOSIS — Z20828 Contact with and (suspected) exposure to other viral communicable diseases: Secondary | ICD-10-CM | POA: Diagnosis not present

## 2019-08-13 DIAGNOSIS — Z01810 Encounter for preprocedural cardiovascular examination: Secondary | ICD-10-CM | POA: Diagnosis not present

## 2019-08-13 DIAGNOSIS — R0789 Other chest pain: Secondary | ICD-10-CM | POA: Diagnosis not present

## 2019-08-13 DIAGNOSIS — R9431 Abnormal electrocardiogram [ECG] [EKG]: Secondary | ICD-10-CM | POA: Diagnosis not present

## 2019-08-18 DIAGNOSIS — M542 Cervicalgia: Secondary | ICD-10-CM | POA: Diagnosis not present

## 2019-08-26 DIAGNOSIS — Z79899 Other long term (current) drug therapy: Secondary | ICD-10-CM | POA: Diagnosis not present

## 2019-08-26 DIAGNOSIS — M5412 Radiculopathy, cervical region: Secondary | ICD-10-CM | POA: Diagnosis not present

## 2019-08-26 DIAGNOSIS — M50123 Cervical disc disorder at C6-C7 level with radiculopathy: Secondary | ICD-10-CM | POA: Diagnosis not present

## 2019-08-26 DIAGNOSIS — M5114 Intervertebral disc disorders with radiculopathy, thoracic region: Secondary | ICD-10-CM | POA: Diagnosis not present

## 2019-08-26 DIAGNOSIS — M5013 Cervical disc disorder with radiculopathy, cervicothoracic region: Secondary | ICD-10-CM | POA: Diagnosis not present

## 2019-08-26 DIAGNOSIS — I10 Essential (primary) hypertension: Secondary | ICD-10-CM | POA: Diagnosis not present

## 2019-08-26 DIAGNOSIS — M5003 Cervical disc disorder with myelopathy, cervicothoracic region: Secondary | ICD-10-CM | POA: Diagnosis not present

## 2019-08-26 DIAGNOSIS — G8929 Other chronic pain: Secondary | ICD-10-CM | POA: Diagnosis not present

## 2019-08-26 DIAGNOSIS — G629 Polyneuropathy, unspecified: Secondary | ICD-10-CM | POA: Diagnosis not present

## 2019-08-26 DIAGNOSIS — E785 Hyperlipidemia, unspecified: Secondary | ICD-10-CM | POA: Diagnosis not present

## 2019-08-31 ENCOUNTER — Other Ambulatory Visit: Payer: Self-pay

## 2019-09-21 DIAGNOSIS — I10 Essential (primary) hypertension: Secondary | ICD-10-CM | POA: Diagnosis not present

## 2019-09-21 DIAGNOSIS — Z1331 Encounter for screening for depression: Secondary | ICD-10-CM | POA: Diagnosis not present

## 2019-09-21 DIAGNOSIS — E785 Hyperlipidemia, unspecified: Secondary | ICD-10-CM | POA: Diagnosis not present

## 2019-09-21 DIAGNOSIS — M5030 Other cervical disc degeneration, unspecified cervical region: Secondary | ICD-10-CM | POA: Diagnosis not present

## 2019-09-24 DIAGNOSIS — H04123 Dry eye syndrome of bilateral lacrimal glands: Secondary | ICD-10-CM | POA: Diagnosis not present

## 2019-09-24 DIAGNOSIS — H05231 Hemorrhage of right orbit: Secondary | ICD-10-CM | POA: Diagnosis not present

## 2019-10-07 DIAGNOSIS — H05231 Hemorrhage of right orbit: Secondary | ICD-10-CM | POA: Diagnosis not present

## 2019-10-07 DIAGNOSIS — H04123 Dry eye syndrome of bilateral lacrimal glands: Secondary | ICD-10-CM | POA: Diagnosis not present

## 2019-10-20 DIAGNOSIS — M47812 Spondylosis without myelopathy or radiculopathy, cervical region: Secondary | ICD-10-CM | POA: Diagnosis not present

## 2019-10-20 DIAGNOSIS — Z981 Arthrodesis status: Secondary | ICD-10-CM | POA: Diagnosis not present

## 2019-10-20 DIAGNOSIS — M5412 Radiculopathy, cervical region: Secondary | ICD-10-CM | POA: Diagnosis not present

## 2019-11-11 DIAGNOSIS — H04123 Dry eye syndrome of bilateral lacrimal glands: Secondary | ICD-10-CM | POA: Diagnosis not present

## 2019-12-08 DIAGNOSIS — M5412 Radiculopathy, cervical region: Secondary | ICD-10-CM | POA: Diagnosis not present

## 2019-12-10 DIAGNOSIS — M48061 Spinal stenosis, lumbar region without neurogenic claudication: Secondary | ICD-10-CM | POA: Diagnosis not present

## 2019-12-10 DIAGNOSIS — M5412 Radiculopathy, cervical region: Secondary | ICD-10-CM | POA: Diagnosis not present

## 2019-12-10 DIAGNOSIS — M5416 Radiculopathy, lumbar region: Secondary | ICD-10-CM | POA: Diagnosis not present

## 2019-12-10 DIAGNOSIS — R531 Weakness: Secondary | ICD-10-CM | POA: Diagnosis not present

## 2019-12-10 DIAGNOSIS — R222 Localized swelling, mass and lump, trunk: Secondary | ICD-10-CM | POA: Diagnosis not present

## 2019-12-10 DIAGNOSIS — Z6825 Body mass index (BMI) 25.0-25.9, adult: Secondary | ICD-10-CM | POA: Diagnosis not present

## 2019-12-11 DIAGNOSIS — M1990 Unspecified osteoarthritis, unspecified site: Secondary | ICD-10-CM | POA: Diagnosis not present

## 2019-12-11 DIAGNOSIS — Z742 Need for assistance at home and no other household member able to render care: Secondary | ICD-10-CM | POA: Diagnosis not present

## 2019-12-11 DIAGNOSIS — M48062 Spinal stenosis, lumbar region with neurogenic claudication: Secondary | ICD-10-CM | POA: Diagnosis not present

## 2019-12-11 DIAGNOSIS — Z7401 Bed confinement status: Secondary | ICD-10-CM | POA: Diagnosis not present

## 2019-12-11 DIAGNOSIS — N390 Urinary tract infection, site not specified: Secondary | ICD-10-CM | POA: Diagnosis not present

## 2019-12-11 DIAGNOSIS — Z79899 Other long term (current) drug therapy: Secondary | ICD-10-CM | POA: Diagnosis not present

## 2019-12-11 DIAGNOSIS — R531 Weakness: Secondary | ICD-10-CM | POA: Diagnosis not present

## 2019-12-11 DIAGNOSIS — M6259 Muscle wasting and atrophy, not elsewhere classified, multiple sites: Secondary | ICD-10-CM | POA: Diagnosis not present

## 2019-12-11 DIAGNOSIS — M5412 Radiculopathy, cervical region: Secondary | ICD-10-CM | POA: Diagnosis not present

## 2019-12-11 DIAGNOSIS — Z981 Arthrodesis status: Secondary | ICD-10-CM | POA: Diagnosis not present

## 2019-12-11 DIAGNOSIS — R338 Other retention of urine: Secondary | ICD-10-CM | POA: Diagnosis not present

## 2019-12-11 DIAGNOSIS — M5416 Radiculopathy, lumbar region: Secondary | ICD-10-CM | POA: Diagnosis not present

## 2019-12-11 DIAGNOSIS — R200 Anesthesia of skin: Secondary | ICD-10-CM | POA: Diagnosis not present

## 2019-12-11 DIAGNOSIS — Z4789 Encounter for other orthopedic aftercare: Secondary | ICD-10-CM | POA: Diagnosis not present

## 2019-12-11 DIAGNOSIS — R52 Pain, unspecified: Secondary | ICD-10-CM | POA: Diagnosis not present

## 2019-12-11 DIAGNOSIS — K5900 Constipation, unspecified: Secondary | ICD-10-CM | POA: Diagnosis not present

## 2019-12-11 DIAGNOSIS — M545 Low back pain: Secondary | ICD-10-CM | POA: Diagnosis not present

## 2019-12-11 DIAGNOSIS — I10 Essential (primary) hypertension: Secondary | ICD-10-CM | POA: Diagnosis not present

## 2019-12-11 DIAGNOSIS — Z882 Allergy status to sulfonamides status: Secondary | ICD-10-CM | POA: Diagnosis not present

## 2019-12-11 DIAGNOSIS — Z888 Allergy status to other drugs, medicaments and biological substances status: Secondary | ICD-10-CM | POA: Diagnosis not present

## 2019-12-11 DIAGNOSIS — Z885 Allergy status to narcotic agent status: Secondary | ICD-10-CM | POA: Diagnosis not present

## 2019-12-11 DIAGNOSIS — R7989 Other specified abnormal findings of blood chemistry: Secondary | ICD-10-CM | POA: Diagnosis not present

## 2019-12-11 DIAGNOSIS — M5489 Other dorsalgia: Secondary | ICD-10-CM | POA: Diagnosis not present

## 2019-12-11 DIAGNOSIS — M48061 Spinal stenosis, lumbar region without neurogenic claudication: Secondary | ICD-10-CM | POA: Diagnosis not present

## 2019-12-11 DIAGNOSIS — E7800 Pure hypercholesterolemia, unspecified: Secondary | ICD-10-CM | POA: Diagnosis not present

## 2019-12-11 DIAGNOSIS — Z48811 Encounter for surgical aftercare following surgery on the nervous system: Secondary | ICD-10-CM | POA: Diagnosis not present

## 2019-12-11 DIAGNOSIS — E876 Hypokalemia: Secondary | ICD-10-CM | POA: Diagnosis not present

## 2019-12-11 DIAGNOSIS — R262 Difficulty in walking, not elsewhere classified: Secondary | ICD-10-CM | POA: Diagnosis not present

## 2019-12-11 DIAGNOSIS — M2550 Pain in unspecified joint: Secondary | ICD-10-CM | POA: Diagnosis not present

## 2019-12-19 DIAGNOSIS — M48061 Spinal stenosis, lumbar region without neurogenic claudication: Secondary | ICD-10-CM | POA: Diagnosis not present

## 2019-12-19 DIAGNOSIS — M5489 Other dorsalgia: Secondary | ICD-10-CM | POA: Diagnosis not present

## 2019-12-19 DIAGNOSIS — Z20828 Contact with and (suspected) exposure to other viral communicable diseases: Secondary | ICD-10-CM | POA: Diagnosis not present

## 2019-12-19 DIAGNOSIS — Z742 Need for assistance at home and no other household member able to render care: Secondary | ICD-10-CM | POA: Diagnosis not present

## 2019-12-19 DIAGNOSIS — N390 Urinary tract infection, site not specified: Secondary | ICD-10-CM | POA: Diagnosis not present

## 2019-12-19 DIAGNOSIS — N3020 Other chronic cystitis without hematuria: Secondary | ICD-10-CM | POA: Diagnosis not present

## 2019-12-19 DIAGNOSIS — M5416 Radiculopathy, lumbar region: Secondary | ICD-10-CM | POA: Diagnosis not present

## 2019-12-19 DIAGNOSIS — Z4789 Encounter for other orthopedic aftercare: Secondary | ICD-10-CM | POA: Diagnosis not present

## 2019-12-19 DIAGNOSIS — M6259 Muscle wasting and atrophy, not elsewhere classified, multiple sites: Secondary | ICD-10-CM | POA: Diagnosis not present

## 2019-12-19 DIAGNOSIS — Z7401 Bed confinement status: Secondary | ICD-10-CM | POA: Diagnosis not present

## 2019-12-19 DIAGNOSIS — R262 Difficulty in walking, not elsewhere classified: Secondary | ICD-10-CM | POA: Diagnosis not present

## 2019-12-19 DIAGNOSIS — N3090 Cystitis, unspecified without hematuria: Secondary | ICD-10-CM | POA: Diagnosis not present

## 2019-12-19 DIAGNOSIS — G8918 Other acute postprocedural pain: Secondary | ICD-10-CM | POA: Diagnosis not present

## 2019-12-19 DIAGNOSIS — R52 Pain, unspecified: Secondary | ICD-10-CM | POA: Diagnosis not present

## 2019-12-19 DIAGNOSIS — Z48811 Encounter for surgical aftercare following surgery on the nervous system: Secondary | ICD-10-CM | POA: Diagnosis not present

## 2019-12-19 DIAGNOSIS — M2550 Pain in unspecified joint: Secondary | ICD-10-CM | POA: Diagnosis not present

## 2019-12-19 DIAGNOSIS — R339 Retention of urine, unspecified: Secondary | ICD-10-CM | POA: Diagnosis not present

## 2019-12-19 DIAGNOSIS — R338 Other retention of urine: Secondary | ICD-10-CM | POA: Diagnosis not present

## 2019-12-21 DIAGNOSIS — Z20828 Contact with and (suspected) exposure to other viral communicable diseases: Secondary | ICD-10-CM | POA: Diagnosis not present

## 2019-12-22 DIAGNOSIS — M48061 Spinal stenosis, lumbar region without neurogenic claudication: Secondary | ICD-10-CM | POA: Diagnosis not present

## 2019-12-22 DIAGNOSIS — R339 Retention of urine, unspecified: Secondary | ICD-10-CM | POA: Diagnosis not present

## 2019-12-22 DIAGNOSIS — R262 Difficulty in walking, not elsewhere classified: Secondary | ICD-10-CM | POA: Diagnosis not present

## 2019-12-22 DIAGNOSIS — G8918 Other acute postprocedural pain: Secondary | ICD-10-CM | POA: Diagnosis not present

## 2019-12-29 DIAGNOSIS — N3020 Other chronic cystitis without hematuria: Secondary | ICD-10-CM | POA: Diagnosis not present

## 2019-12-29 DIAGNOSIS — R338 Other retention of urine: Secondary | ICD-10-CM | POA: Diagnosis not present

## 2019-12-31 DIAGNOSIS — N3090 Cystitis, unspecified without hematuria: Secondary | ICD-10-CM | POA: Diagnosis not present

## 2020-01-16 DIAGNOSIS — Z9181 History of falling: Secondary | ICD-10-CM | POA: Diagnosis not present

## 2020-01-16 DIAGNOSIS — E785 Hyperlipidemia, unspecified: Secondary | ICD-10-CM | POA: Diagnosis not present

## 2020-01-16 DIAGNOSIS — Z4789 Encounter for other orthopedic aftercare: Secondary | ICD-10-CM | POA: Diagnosis not present

## 2020-01-16 DIAGNOSIS — R339 Retention of urine, unspecified: Secondary | ICD-10-CM | POA: Diagnosis not present

## 2020-01-16 DIAGNOSIS — D649 Anemia, unspecified: Secondary | ICD-10-CM | POA: Diagnosis not present

## 2020-01-16 DIAGNOSIS — K5900 Constipation, unspecified: Secondary | ICD-10-CM | POA: Diagnosis not present

## 2020-01-16 DIAGNOSIS — M5416 Radiculopathy, lumbar region: Secondary | ICD-10-CM | POA: Diagnosis not present

## 2020-01-16 DIAGNOSIS — Z981 Arthrodesis status: Secondary | ICD-10-CM | POA: Diagnosis not present

## 2020-01-16 DIAGNOSIS — M810 Age-related osteoporosis without current pathological fracture: Secondary | ICD-10-CM | POA: Diagnosis not present

## 2020-01-16 DIAGNOSIS — M48061 Spinal stenosis, lumbar region without neurogenic claudication: Secondary | ICD-10-CM | POA: Diagnosis not present

## 2020-01-16 DIAGNOSIS — G629 Polyneuropathy, unspecified: Secondary | ICD-10-CM | POA: Diagnosis not present

## 2020-01-16 DIAGNOSIS — Z79891 Long term (current) use of opiate analgesic: Secondary | ICD-10-CM | POA: Diagnosis not present

## 2020-01-25 DIAGNOSIS — N319 Neuromuscular dysfunction of bladder, unspecified: Secondary | ICD-10-CM | POA: Diagnosis not present

## 2020-01-25 DIAGNOSIS — R339 Retention of urine, unspecified: Secondary | ICD-10-CM | POA: Diagnosis not present

## 2020-01-26 DIAGNOSIS — M47819 Spondylosis without myelopathy or radiculopathy, site unspecified: Secondary | ICD-10-CM | POA: Diagnosis not present

## 2020-01-26 DIAGNOSIS — Z79899 Other long term (current) drug therapy: Secondary | ICD-10-CM | POA: Diagnosis not present

## 2020-01-26 DIAGNOSIS — Z7189 Other specified counseling: Secondary | ICD-10-CM | POA: Diagnosis not present

## 2020-01-26 DIAGNOSIS — Z6824 Body mass index (BMI) 24.0-24.9, adult: Secondary | ICD-10-CM | POA: Diagnosis not present

## 2020-01-26 DIAGNOSIS — M5030 Other cervical disc degeneration, unspecified cervical region: Secondary | ICD-10-CM | POA: Diagnosis not present

## 2020-01-26 DIAGNOSIS — I10 Essential (primary) hypertension: Secondary | ICD-10-CM | POA: Diagnosis not present

## 2020-01-26 DIAGNOSIS — E785 Hyperlipidemia, unspecified: Secondary | ICD-10-CM | POA: Diagnosis not present

## 2020-02-02 ENCOUNTER — Ambulatory Visit: Payer: PPO | Admitting: Neurology

## 2020-02-02 ENCOUNTER — Other Ambulatory Visit: Payer: Self-pay

## 2020-02-02 ENCOUNTER — Encounter: Payer: Self-pay | Admitting: Neurology

## 2020-02-02 ENCOUNTER — Telehealth: Payer: Self-pay | Admitting: Neurology

## 2020-02-02 VITALS — BP 134/82 | HR 83 | Temp 97.4°F | Ht 60.5 in | Wt 133.0 lb

## 2020-02-02 DIAGNOSIS — R32 Unspecified urinary incontinence: Secondary | ICD-10-CM | POA: Diagnosis not present

## 2020-02-02 DIAGNOSIS — R159 Full incontinence of feces: Secondary | ICD-10-CM

## 2020-02-02 DIAGNOSIS — G3281 Cerebellar ataxia in diseases classified elsewhere: Secondary | ICD-10-CM | POA: Diagnosis not present

## 2020-02-02 DIAGNOSIS — R269 Unspecified abnormalities of gait and mobility: Secondary | ICD-10-CM

## 2020-02-02 NOTE — Progress Notes (Addendum)
PATIENT: Tanaiyah Christie Kichline DOB: [birth year]  Chief Complaint  Patient presents with  . Back Pain    She is with here daughter, Linus Orn. Reports numbness in bilateral feet, outer sides of lower extremities, buttocks and pelvic area. She has developed urinary incontinence. Symptoms started in early March. She had surgery for spinal stenosis on 12/14/19 but has continued to have the numbness. She is using a walker to assist with ambulation.   Marland Kitchen PCP    Ernestene Kiel, MD      HISTORICAL  Kashima Hovey Reist is a 69 year old female, seen in request by her primary care physician Dr. Ernestene Kiel, for evaluation of gait abnormality, incontinence, initial evaluation was on February 02, 2020.  She is accompanied by her daughter [name] at visit.  I have reviewed and summarized the referring note from the referring physician.  She had past medical history of hypertension, hyperlipidemia, cervical decompression surgery twice, initial surgery was done at [facility] by orthopedic surgeon Dr. Ellender Hose in 2010, she presented with neck pain, radiating pain to left upper extremity, had a posterior fusion of C3-7, complains of occasionally flareup of neck pain, radiating pain to left upper extremity, arm weakness.  Has been followed up by orthopedic surgeon regularly, had  second cervical decompression of C7-T1 surgery by [facility] orthopedic surgeon Dr. Donivan Scull in November 2020 for progressive right hand weakness, numbness, recurrent neck pain radiating to right upper extremity, post surgery has helped her symptoms, at baseline, she is highly function, ambulate without any difficulty.  She does has residual right hand muscle atrophy, and weakness.  On December 10, 2019, she was able to go shopping at her baseline, unload her car, at 6 PM, she had sudden onset severe left lower extremity pain, starting at left lateral leg, spreading rostrally to involving left lateral thigh and, in the left foot, severe burning  sensation lasting for 30 minutes, she was able to walk to her mailbox, 1 hour later, by 7 PM, she had a sudden onset of right lower extremity involvement, in a similar pattern, but seems to be a milder degree, starting from right lateral calf, extending to right thigh, and right foot, she took her hydrocodone rested for a while, while taking a shower, she noticed difficulty bearing weight, drying her hair, by 8 PM, she had such severe bilateral lower extremity pain, she called her son to bring her to local emergency room, it is difficult for her to sit still due to extreme bilateral lower extremity pain from bilateral hip done, when she is trying to get up changing position, she noticed bilateral lower extremity weakness, difficulty bearing weight  At night of March for 2021 at emergency room of [facility], she got CT lumbar spine, I personally reviewed the film, Multilevel lumbar degenerative changes, there is multifactorial spinal stenosis, most severe at L4-5 followed by L3-4 then L2-3. At L5-S1, there is narrowing of the subarticular lateral recesses and foramina right more than left. Certainly, the findings are severe enough to be symptomatic.  During her emergency room stay in, she develop urinary retention, went to bathroom multiple times without emptying her bladder required cath, she continue complains severe bilateral lower extremity pain, difficulty walking, bilateral lower extremity weakness.  She had MRI on December 11, 2019, I personally reviewed the film, covered T11 through upper sacral region, coronal section cover T12 to lumbar region, multilevel degenerative changes, moderately severe spinal stenosis at L4-5, right lateral recess compression could affect right L5 nerve roots,  mild spinal stenosis at L3-4, moderate bilateral facet arthritis at L5-S1.   She underwent posterior decompression with laminectomy for decompression of cauda equina and nerve root at L4-5, L3-4 by Dr. Donivan Scull on  December 16, 2019.  From reviewing Dr. Towanda Octave presurgical evaluation, patient was noted to have weakness involving bilateral foot dorsiflexion, and plantarflexion.  Surgery did help her low back pain, which is under reasonable control now, she went to rehab, but still has persistent bilateral leg weakness, especially distal leg, gait abnormality.  She had a urinary retention at emergency room, now she has urinary incontinence, barely any urge then urinary incontinence without warning signs, when she defecate, she could not feel it come out, she has paresthesia at her saddle area.  REVIEW OF SYSTEMS: Full 14 system review of systems performed and notable only for as above All other review of systems were negative.  ALLERGIES: Allergies  Allergen Reactions  . Cymbalta [Duloxetine Hcl] Nausea Only and Other (See Comments)    Shakiness  . Other     Sodium lauryl sulfate - causes mouth sores   . Percocet [Oxycodone-Acetaminophen] Itching  . Talwin [Pentazocine] Nausea And Vomiting and Other (See Comments)    Hallucinations  . Bactrim [Sulfamethoxazole-Trimethoprim] Itching and Rash  . Sulfa Antibiotics Itching and Rash    HOME MEDICATIONS: Current Outpatient Medications  Medication Sig Dispense Refill  . acetaminophen (TYLENOL) 500 MG tablet Take 1,000 mg by mouth every 8 (eight) hours as needed.    . bethanechol (URECHOLINE) 50 MG tablet Take 50 mg by mouth in the morning and at bedtime.    Marland Kitchen CALCIUM PO Take 1,000 mg by mouth daily.    . Cholecalciferol (VITAMIN D3) 1000 units CAPS Take by mouth daily.    . Coenzyme Q10 (COQ10 PO) Take 100 mg by mouth daily.    Marland Kitchen gabapentin (NEURONTIN) 300 MG capsule Take 300 mg by mouth 2 (two) times daily.     Marland Kitchen HYDROCHLOROTHIAZIDE PO Take 12.5 mg by mouth daily.     Marland Kitchen HYDROcodone-acetaminophen (NORCO/VICODIN) 5-325 MG tablet Take 1 tablet by mouth every 4 (four) hours as needed.    Marland Kitchen Lifitegrast (XIIDRA) 5 % SOLN Apply 1 drop to eye daily. Both eyes     . omega-3 acid ethyl esters (LOVAZA) 1 g capsule Take 2 g by mouth 2 (two) times daily.     . polyethylene glycol (MIRALAX / GLYCOLAX) 17 g packet Take 17 g by mouth daily.    . rosuvastatin (CRESTOR) 10 MG tablet Take 10 mg by mouth daily.    . SENNA PO Take 3.6 mg by mouth at bedtime.    . tamsulosin (FLOMAX) 0.4 MG CAPS capsule Take 0.4 mg by mouth daily.    . traMADol (ULTRAM) 50 MG tablet Take 50 mg by mouth every 6 (six) hours as needed.     No current facility-administered medications for this visit.    PAST MEDICAL HISTORY: Past Medical History:  Diagnosis Date  . Constipation   . High blood pressure   . Hypercholesteremia   . Numbness   . Spinal stenosis   . Urinary incontinence     PAST SURGICAL HISTORY: Past Surgical History:  Procedure Laterality Date  . APPENDECTOMY  03/11/1979  . BREAST BIOPSY Left 08/13/1989  . BREAST BIOPSY Left 10/05/1998  . BUNIONECTOMY  02/04/1986  . CERVICAL FUSION  2010, 2020  . GYNECOLOGIC CRYOSURGERY    . OTHER SURGICAL HISTORY  12/14/2014   Right arm radial nerve release  .  POSTERIOR LAMINECTOMY / DECOMPRESSION LUMBAR SPINE     L3-5  . right radial nerve release    . TOE SURGERY Right 01/24/2006  . TUBAL LIGATION  03/11/1979  . ULNAR NERVE TRANSPOSITION Left 08/31/1987  . UMBILICAL HERNIA REPAIR  01/20/2013    FAMILY HISTORY: Family History  Problem Relation Age of Onset  . Allergic rhinitis Mother   . Congestive Heart Failure Mother   . Emphysema Mother   . Hypertension Mother   . Hypercholesterolemia Mother   . Anxiety disorder Mother   . Cancer - Other Father        Oral Cancer  . Stroke Father   . Benign prostatic hyperplasia Father   . COPD Sister   . Diabetes Maternal Aunt   . Diabetes Maternal Grandmother     SOCIAL HISTORY: Social History   Socioeconomic History  . Marital status: Single    Spouse name: Not on file  . Number of children: 2  . Years of education: 14 years  . Highest education level:  Not on file  Occupational History  . Occupation: Retired  Tobacco Use  . Smoking status: Never Smoker  . Smokeless tobacco: Never Used  Substance and Sexual Activity  . Alcohol use: Not Currently  . Drug use: Never  . Sexual activity: Not on file  Other Topics Concern  . Not on file  Social History Narrative   Lives at home alone.   Right-handed.   One cup caffeine per day.   Social Determinants of Health   Financial Resource Strain:   . Difficulty of Paying Living Expenses:   Food Insecurity:   . Worried About Charity fundraiser in the Last Year:   . Arboriculturist in the Last Year:   Transportation Needs:   . Film/video editor (Medical):   Marland Kitchen Lack of Transportation (Non-Medical):   Physical Activity:   . Days of Exercise per Week:   . Minutes of Exercise per Session:   Stress:   . Feeling of Stress :   Social Connections:   . Frequency of Communication with Friends and Family:   . Frequency of Social Gatherings with Friends and Family:   . Attends Religious Services:   . Active Member of Clubs or Organizations:   . Attends Archivist Meetings:   Marland Kitchen Marital Status:   Intimate Partner Violence:   . Fear of Current or Ex-Partner:   . Emotionally Abused:   Marland Kitchen Physically Abused:   . Sexually Abused:      PHYSICAL EXAM   Vitals:   02/02/20 1029  BP: 134/82  Pulse: 83  Temp: (!) 97.4 F (36.3 C)  Weight: 133 lb (60.3 kg)  Height: 5' 0.5" (1.537 m)    Not recorded      Body mass index is 25.55 kg/m.  PHYSICAL EXAMNIATION:  Gen: NAD, conversant, well nourised, well groomed                     Cardiovascular: Regular rate rhythm, no peripheral edema, warm, nontender. Eyes: Conjunctivae clear without exudates or hemorrhage Neck: Supple, no carotid bruits. Pulmonary: Clear to auscultation bilaterally   NEUROLOGICAL EXAM:  MENTAL STATUS: Speech:    Speech is normal; fluent and spontaneous with normal comprehension.  Cognition:      Orientation to time, place and person     Normal recent and remote memory     Normal Attention span and concentration     Normal  Language, naming, repeating,spontaneous speech     Fund of knowledge   CRANIAL NERVES: CN II: Visual fields are full to confrontation. Pupils are round equal and briskly reactive to light. CN III, IV, VI: extraocular movement are normal. No ptosis. CN V: Facial sensation is intact to light touch CN VII: Face is symmetric with normal eye closure  CN VIII: Hearing is normal to causal conversation. CN IX, X: Phonation is normal. CN XI: Head turning and shoulder shrug are intact  MOTOR: Significant atrophy of right hand intrinsic muscles, mild right shoulder rotation, extension, flexion weakness, right finger abduction, grip weakness.   Lower extremity normal muscle tone, muscle strength (R/L), hip flexion 5/5, hip adduction 5/5, hip abduction 5/5 -, knee extension 5/5, knee flexion 5/5 -, ankle dorsiflexion 4/4 -, ankle plantarflexion 4/3, ankle inversion 4/4, eversion 4/4 -  REFLEXES: Reflexes are 2+ and symmetric at the biceps, triceps, 3/3 knees, and absent at ankles. Plantar responses are flexor on right, mute on left  SENSORY: Length dependent decreased to light touch, vibratory sensation, pinprick to mid shin level  COORDINATION: There is no trunk or limb dysmetria noted.  GAIT/STANCE: She rely on her walker to get up from seated position, difficulty raise bilateral ankle off the floor, very unsteady, stiff gait  DIAGNOSTIC DATA (LABS, IMAGING, TESTING) - I reviewed patient records, labs, notes, testing and imaging myself where available.   ASSESSMENT AND PLAN  WILLENA DEGOLIER is a 69 y.o. female    History of cervical radiculopathy status post cervical decompression surgery in the past, Presented with sudden onset left lower extremity followed by right lower extremity extreme pain and weakness, sensory loss, urinary retention, bowel  incontinence,  Status post lumbar L4-5, L3-4 decompression surgery on December 14, 2019.  She continue have gait abnormality, on examination, she has significant bilateral lower extremity distal muscle weakness, left worse than right, involving left knee flexion, left ankle plantar flexion, eversion more than left ankle dorsiflexion, and inversion, similar pattern but milder degree on the right side  Hyperreflexia of bilateral knee, absent ankle reflex, length dependent sensory changes,  Continued bowel and bladder incontinence, change from retention pattern to incontinence, saddle area sensory loss,  Potential localized lesion to lower thoracic, superimposed residual deficit from cervical radiculopathy, and lumbar radiculopathy  Stat MRI of cervical, thoracic, lumbar spine with without contrast,  EMG nerve conduction study   Marcial Pacas, M.D. Ph.D.  [facility] [address], [place], [place] [address] Ph: [phone] Fax: [phone]  CC: Ernestene Kiel, MD  Total time spent reviewing the chart, obtaining history, examined patient, ordering tests, documentation, consultations and family, care coordination was 70 minutes.

## 2020-02-02 NOTE — Telephone Encounter (Signed)
The first available I could find is Beverly Wolfe for tomorrow 02/03/20 arrival time is 10:30 AM. I informed the patient Dr. Krista Blue wanted her to have this done sooner than later and this was the first one I could find. I informed her I called [name] but their machine is broke and they do not know when they will be able to have it fixed. She took all the information down hopefully she will go. They also have Dr. Krista Blue cell phone number so they will cal you with the report..They also informed me they need an order in their for a bun and creatinine.

## 2020-02-03 ENCOUNTER — Telehealth: Payer: Self-pay | Admitting: Neurology

## 2020-02-03 ENCOUNTER — Ambulatory Visit
Admission: RE | Admit: 2020-02-03 | Discharge: 2020-02-03 | Disposition: A | Discharge status: Home / Self Care | Payer: PPO | Source: Ambulatory Visit | Attending: Neurology | Admitting: Neurology

## 2020-02-03 ENCOUNTER — Other Ambulatory Visit: Payer: Self-pay

## 2020-02-03 ENCOUNTER — Encounter: Payer: Self-pay | Admitting: Neurology

## 2020-02-03 DIAGNOSIS — G3281 Cerebellar ataxia in diseases classified elsewhere: Secondary | ICD-10-CM | POA: Insufficient documentation

## 2020-02-03 DIAGNOSIS — M5124 Other intervertebral disc displacement, thoracic region: Secondary | ICD-10-CM | POA: Diagnosis not present

## 2020-02-03 DIAGNOSIS — G9511 Acute infarction of spinal cord (embolic) (nonembolic): Secondary | ICD-10-CM | POA: Insufficient documentation

## 2020-02-03 DIAGNOSIS — M4802 Spinal stenosis, cervical region: Secondary | ICD-10-CM | POA: Diagnosis not present

## 2020-02-03 DIAGNOSIS — M48061 Spinal stenosis, lumbar region without neurogenic claudication: Secondary | ICD-10-CM | POA: Diagnosis not present

## 2020-02-03 IMAGING — MR MR CERVICAL SPINE WO/W CM
5 of 8 series · 28 of 48 positions shown · IV contrast (gadavist)
Comparison: Cervical spine MRI [DATE] and earlier.

CLINICAL DATA: 69-year-old female with ataxia. Urinary
incontinence. Numbness in the bilateral pelvis, buttocks, lower
extremities. Prior surgery, most recently lumbar spine surgery last
month but persistent numbness.

EXAM:
MRI CERVICAL SPINE WITHOUT AND WITH CONTRAST
TECHNIQUE: Multiplanar and multiecho pulse sequences of the cervical spine, to
include the craniocervical junction and cervicothoracic junction,
were obtained without and with intravenous contrast.
CONTRAST:  6mL GADAVIST GADOBUTROL 1 MMOL/ML IV SOLN

[Series 16: T2 · sagittal · 3.0mm · 0.62mm/px · 4 of 15 slices shown (1 of 2)]
[im 1/15]
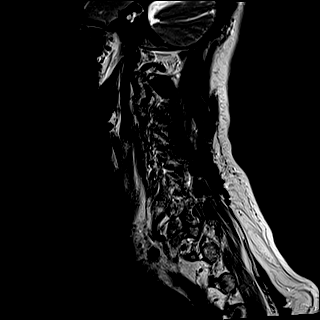
[im 5/15]
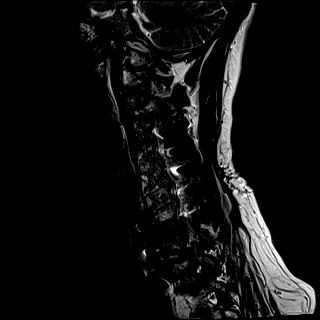
[im 10/15]
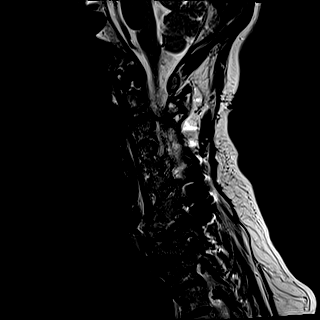
[im 15/15]
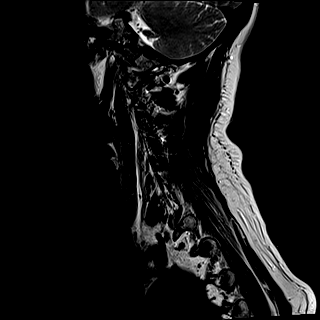

[Series 18: STIR · sagittal · 3.0mm · 0.62mm/px · 4 of 15 slices shown]
[im 1/15]
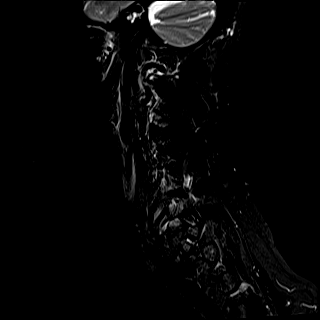
[im 5/15]
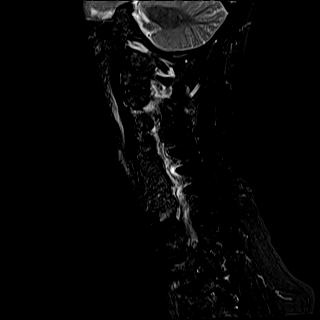
[im 10/15]
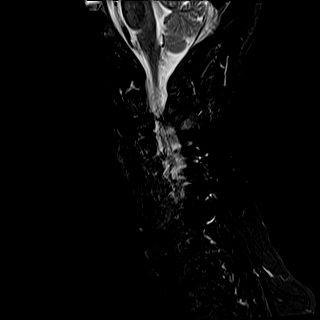
[im 15/15]
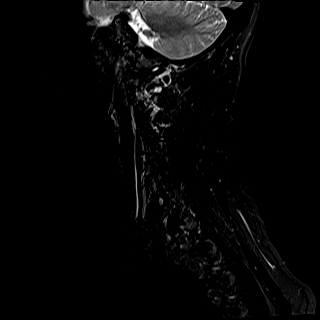

[Series 19: T2 · axial · 3.0mm · 0.70mm/px · z∈[-40,+55]mm · 8 of 29 slices shown (2 of 2)]
[im 1/29]
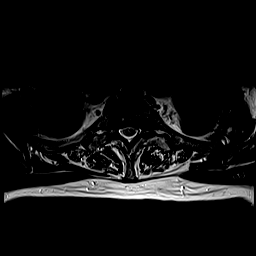
[im 5/29]
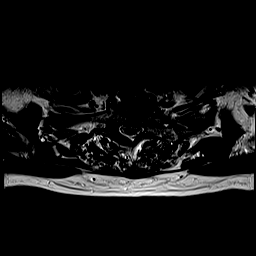
[im 9/29]
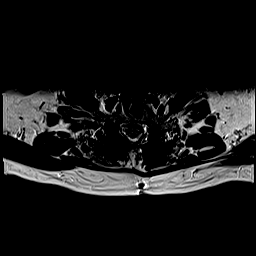
[im 13/29]
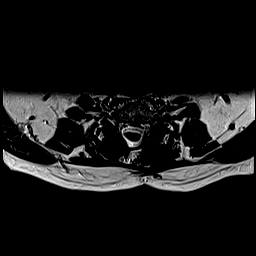
[im 17/29]
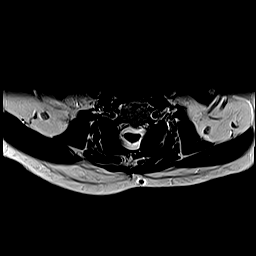
[im 21/29]
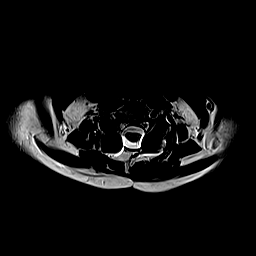
[im 25/29]
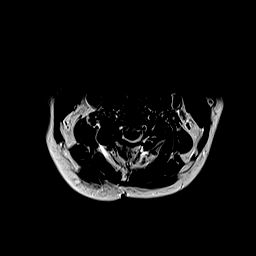
[im 29/29]
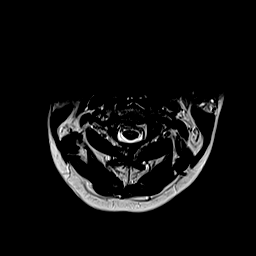

[Series 21: T1 · axial · non-contrast · 3.0mm · 0.35mm/px · z∈[-40,+55]mm · 8 of 29 slices shown]
[im 1/29]
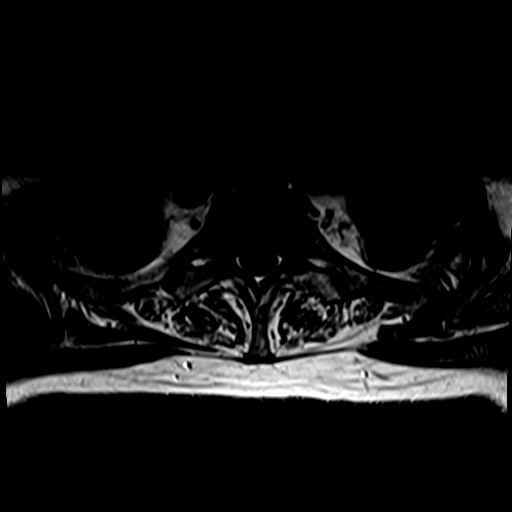
[im 5/29]
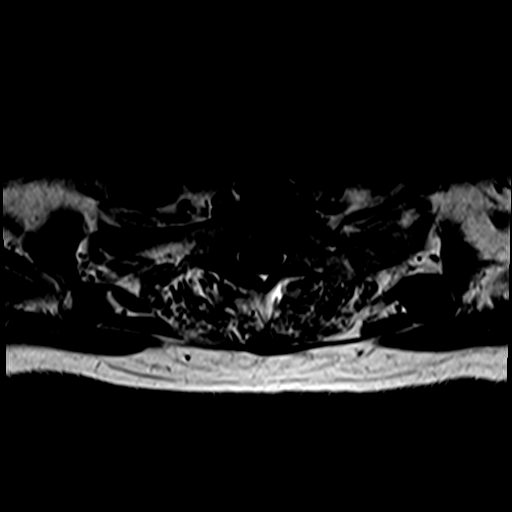
[im 9/29]
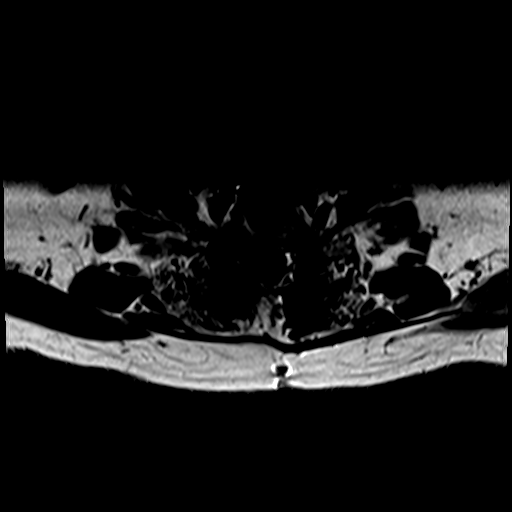
[im 13/29]
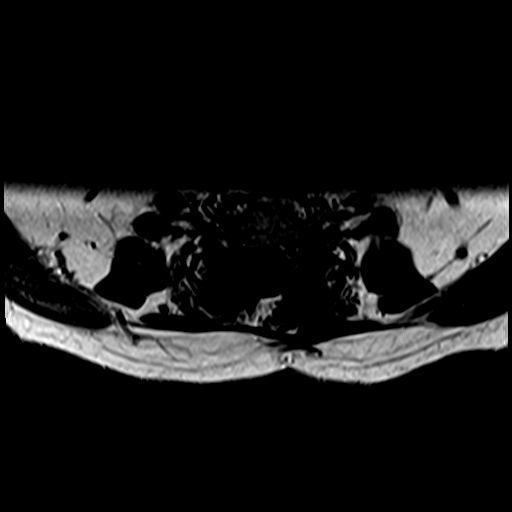
[im 17/29]
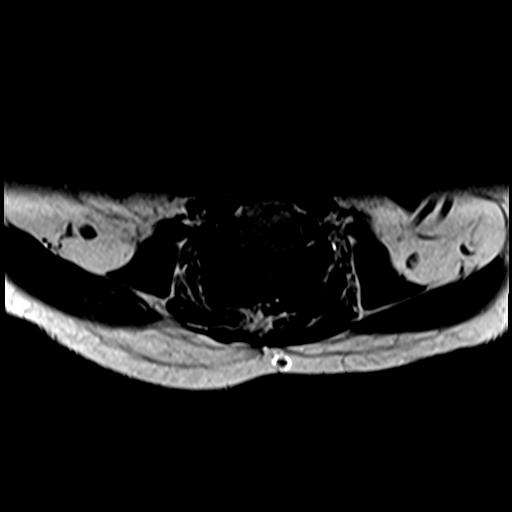
[im 21/29]
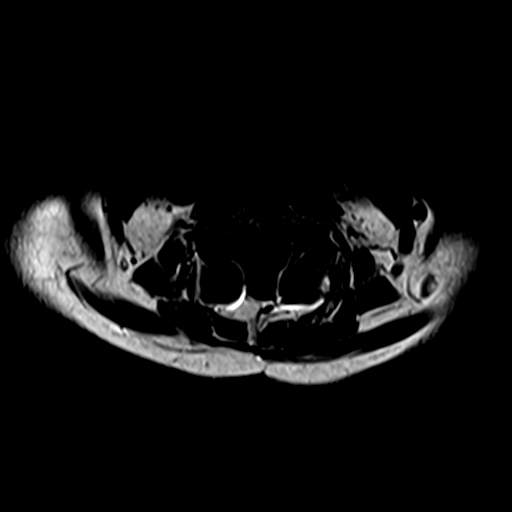
[im 25/29]
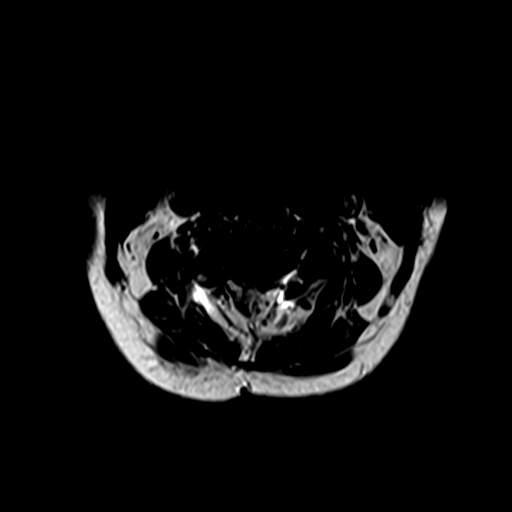
[im 29/29]
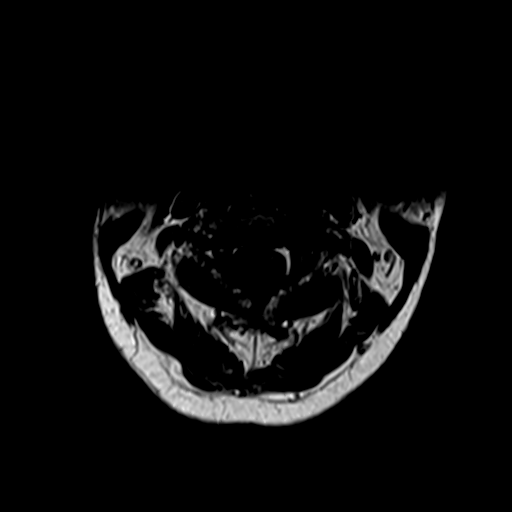

[Series 23: T1 post-contrast · axial · 3.0mm · 0.35mm/px · z∈[-40,+1]mm · 4 of 29 slices shown]
[im 1/29]
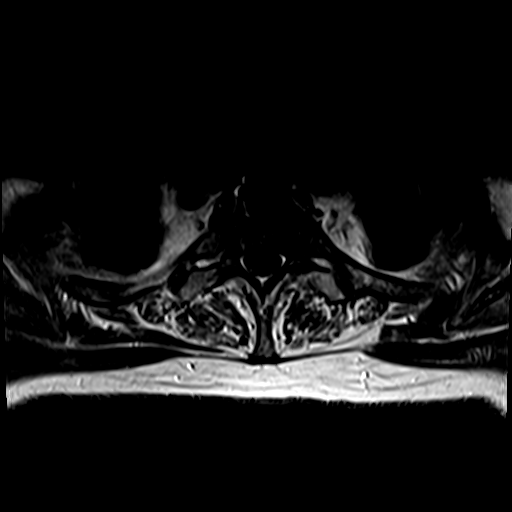
[im 5/29]
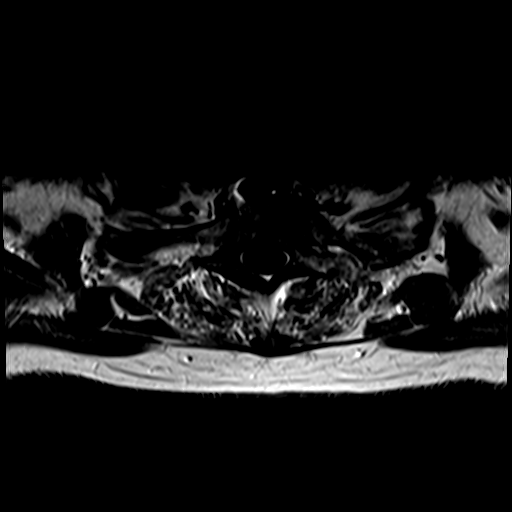
[im 9/29]
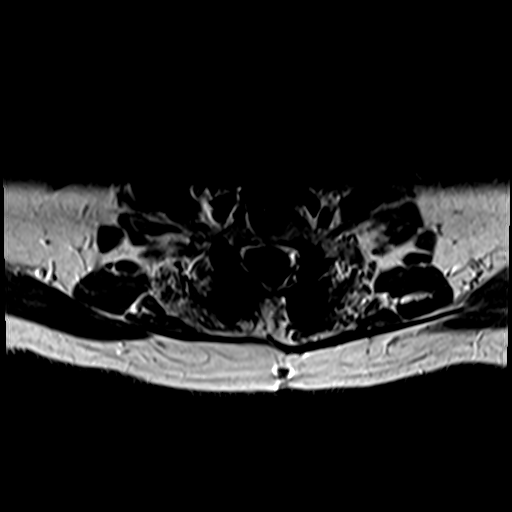
[im 13/29]
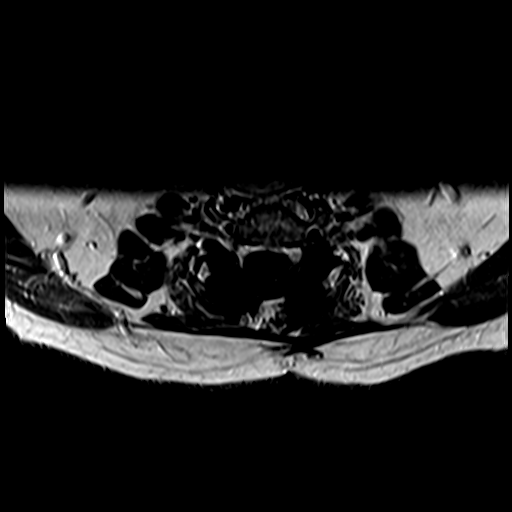

[28 of 48 positions shown; findings below may reference images not displayed]

FINDINGS: Alignment: Chronic straightening of cervical lordosis. Subtle
anterolisthesis of C2 on C3 is stable.

Vertebrae: Mild susceptibility artifact related to chronic posterior
cervical hardware from C3 through C7. New cervicothoracic junction
ACDF hardware artifact C7-T1 and T1-T2. No marrow edema or evidence
of acute osseous abnormality.

Evidence of chronically solid interbody arthrodesis C4 through C7.
And probable developing interbody arthrodesis at C3-C4 (series 16,
image 7). Normal background bone marrow signal.

Cord: Spinal cord signal is within normal limits at all visualized
levels. Capacious cervical spinal canal at most levels. No abnormal
intradural enhancement. No dural thickening.

Posterior Fossa, vertebral arteries, paraspinal tissues:
Cervicomedullary junction is within normal limits. Stable and
negative visible posterior fossa. Preserved major vascular flow
voids in the neck.

Chronic postoperative changes to the posterior neck soft tissues
with no postoperative fluid collection or adverse features. Negative
other visible neck soft tissues and lung apices.

Disc levels:

C2-C3: Progressed right side facet arthropathy since last year but
possible developing facet ankylosis (series 19, image 3). Overall
moderate posterior element hypertrophy including ligament flavum.
Mild disc bulge and endplate spurring.

Stable borderline to mild spinal stenosis. Mild left and moderate
right C3 foraminal stenosis appears stable.

C3-C4:  Prior posterior decompression and fusion. No stenosis.

C4-C5:  Prior decompression and fusion with no stenosis.

C5-C6:  Prior decompression and fusion with no stenosis.

C6-C7:  Prior decompression and fusion. No stenosis.

C7-T1: Interval ACDF. Moderate chronic facet and ligament flavum
hypertrophy. Thecal sac patency appears improved from [DATE].
And bilateral C8 foraminal patency also appears improved.

T1-T2: Interval ACDF. Chronic circumferential disc osteophyte
complex and mild to moderate posterior element hypertrophy. Thecal
sac patency appears improved. Moderate to severe bilateral T1
foraminal stenosis also appears improved, moderate residual stenosis
suspected.
IMPRESSION: 1. Chronic C3 through C7 fusion with suspected solid arthrodesis and
no residual stenosis. Progressed facet arthropathy on the right at
the adjacent C2-C3 segment, although possible developing facet
ankylosis there now. Moderate right C3 foraminal stenosis appears
stable.

2. New cervicothoracic junction ACDF at C7-T1 and T1-T2 ACDF with
improved thecal sac and foraminal patency at those levels.

3. No cervical spinal cord mass effect or signal abnormality.

## 2020-02-03 IMAGING — MR MR THORACIC SPINE WO/W CM
7 of 9 series · 29 of 48 positions shown · IV contrast (gadavist)
Comparison: Cervical spine MRI today reported separately.
COMPARISON: Cervical spine MRI today reported separately.

Addendum:
CLINICAL DATA: 69-year-old female with ataxia. Urinary
incontinence. Numbness in the bilateral pelvis, buttocks, lower
extremities. Prior surgery, most recently lumbar spine surgery last
month but persistent numbness.

EXAM:
MRI THORACIC WITHOUT AND WITH CONTRAST
TECHNIQUE: Multiplanar and multiecho pulse sequences of the thoracic spine were
obtained without and with intravenous contrast.
CONTRAST:  6mL GADAVIST GADOBUTROL 1 MMOL/ML IV SOLN in conjunction
with contrast enhanced imaging of the cervical spine reported
separately.

[Series 16: T1 · sagittal · 5.0mm · 1.88mm/px · 1 of 9 slices shown (1 of 2)]
[im 1/9]
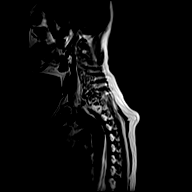

[Series 17: T2 · sagittal · 3.0mm · 1.33mm/px · 3 of 17 slices shown (1 of 2)]
[im 1/17]
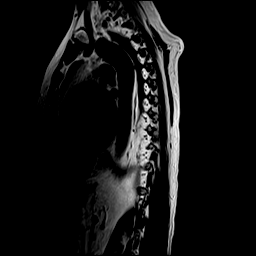
[im 9/17]
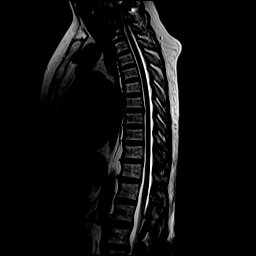
[im 17/17]
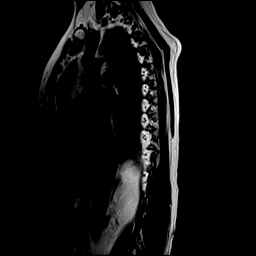

[Series 18: T1 · sagittal · 3.0mm · 1.33mm/px · 4 of 17 slices shown (2 of 2)]
[im 1/17]
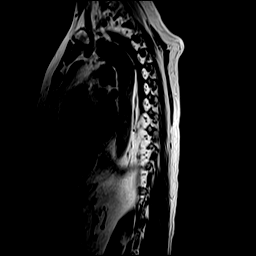
[im 6/17]
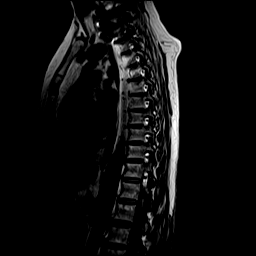
[im 11/17]
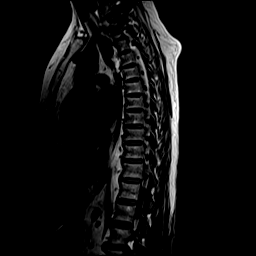
[im 17/17]
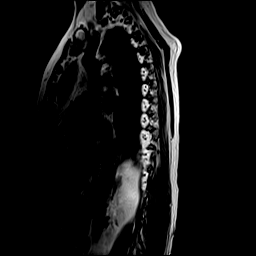

[Series 19: STIR · sagittal · 3.0mm · 0.66mm/px · 4 of 17 slices shown]
[im 1/17]
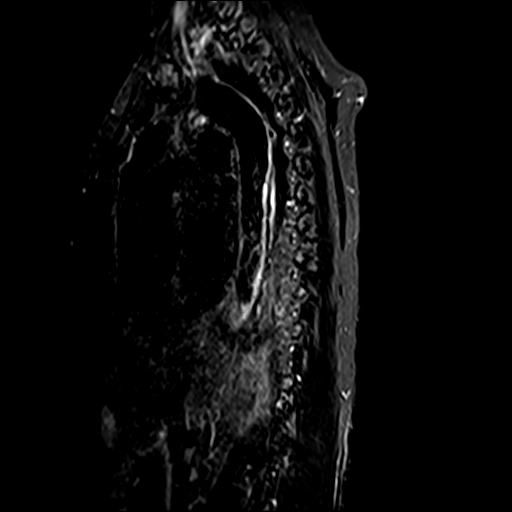
[im 6/17]
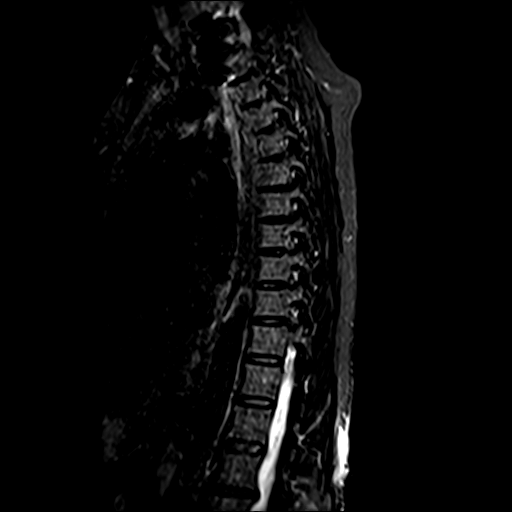
[im 11/17]
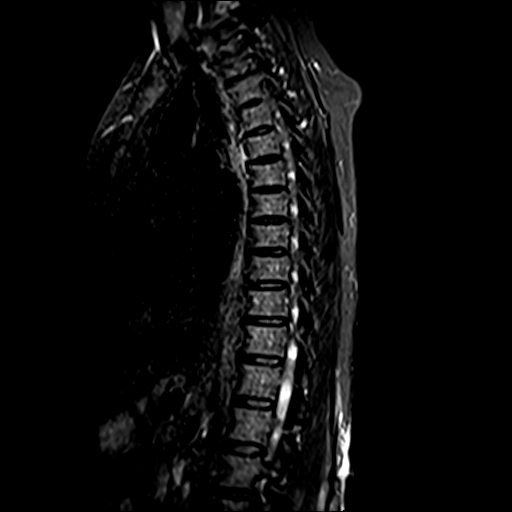
[im 17/17]
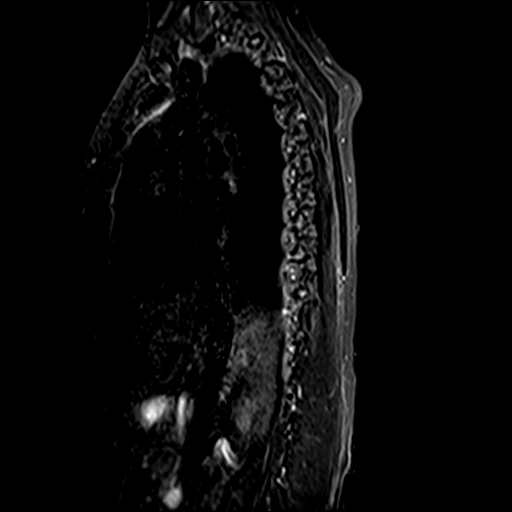

[Series 20: T2 · axial · 4.0mm · 0.59mm/px · z∈[-246,-23]mm · 8 of 39 slices shown (2 of 2)]
[im 1/39]
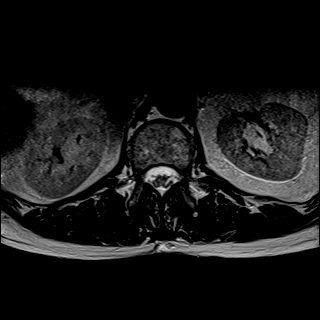
[im 6/39]
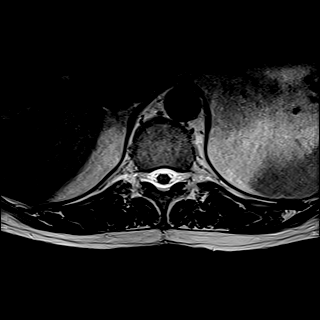
[im 11/39]
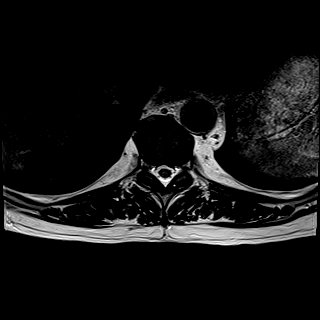
[im 17/39]
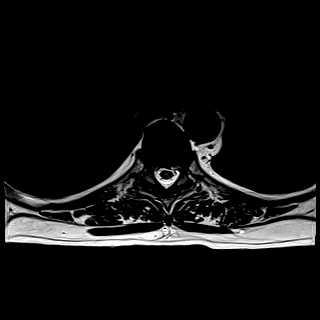
[im 22/39]
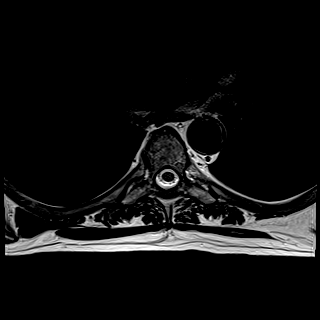
[im 28/39]
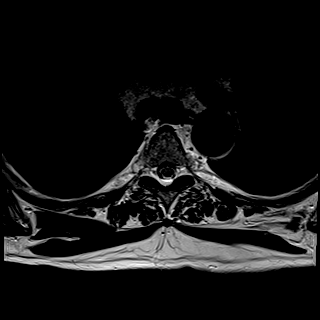
[im 33/39]
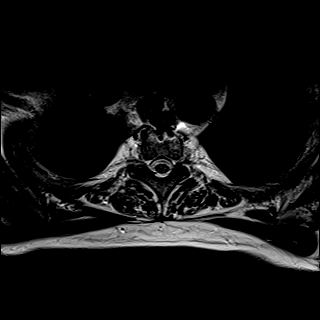
[im 39/39]
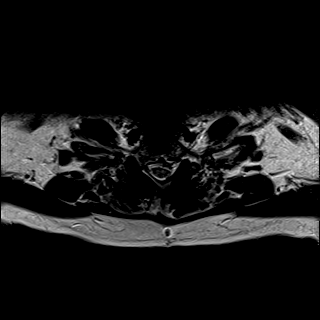

[Series 22: T1 post-contrast · axial · non-contrast · 4.0mm · 0.37mm/px · z∈[-246,-96]mm · 5 of 39 slices shown]
[im 1/39]
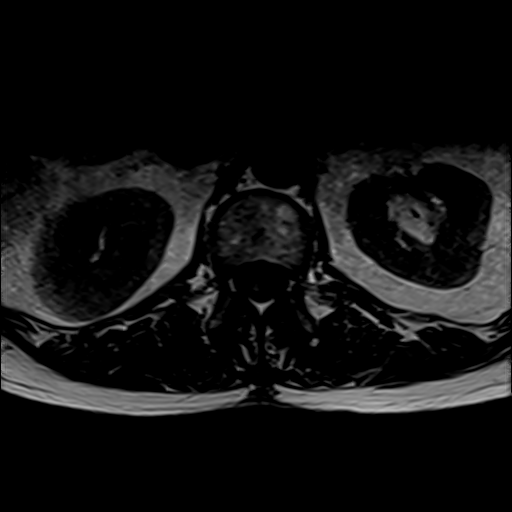
[im 6/39]
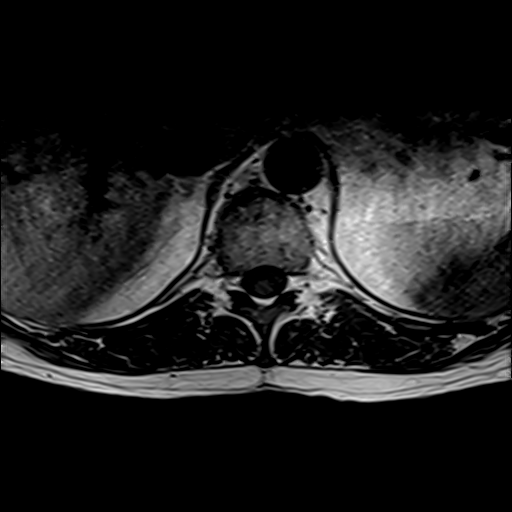
[im 11/39]
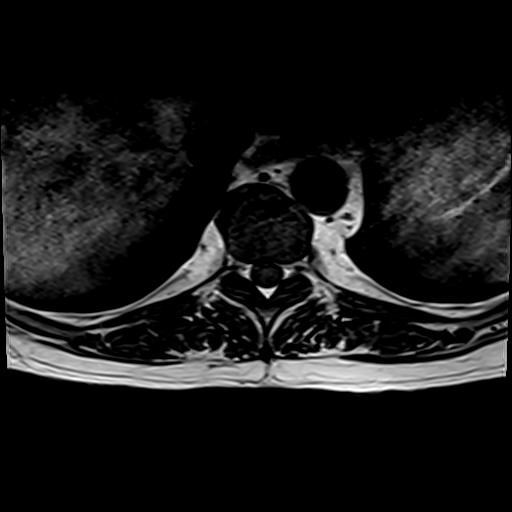
[im 17/39]
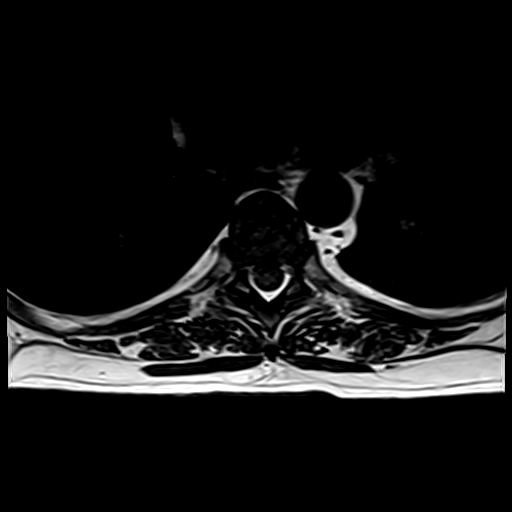
[im 22/39]
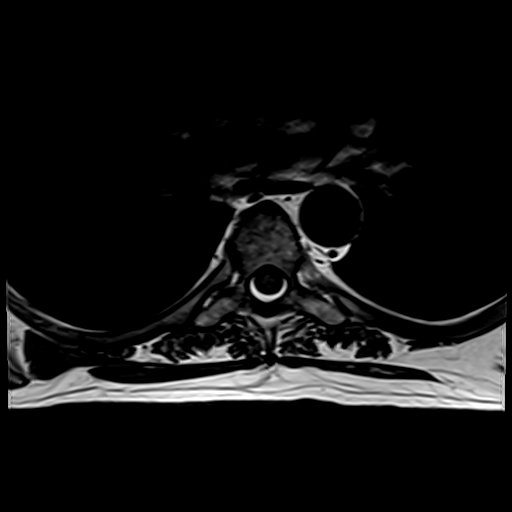

[Series 23: T1 fat-sat post-contrast · sagittal · 3.0mm · 1.33mm/px · 4 of 17 slices shown]
[im 1/17]
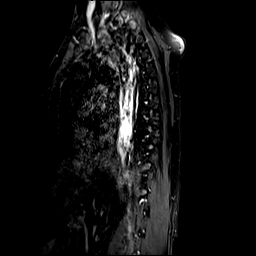
[im 6/17]
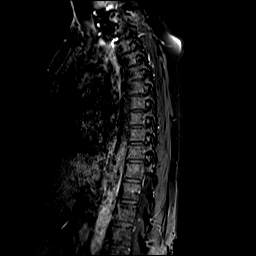
[im 11/17]
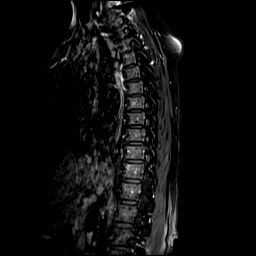
[im 17/17]
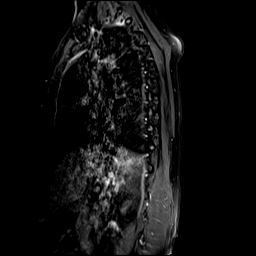

[29 of 48 positions shown; findings below may reference images not displayed]

FINDINGS: Limited cervical spine imaging:  Reported separately today.

Thoracic spine segmentation:  Appears to be normal.

Alignment:  Preserved thoracic kyphosis. No spondylolisthesis.

Vertebrae: Hardware susceptibility artifact at C7-T1 and T1-T2
related to ACDF which is new from [DATE] as reported on the
cervical study today. No marrow edema or evidence of acute osseous
abnormality. Background bone marrow signal appears within normal
limits.

Cord: Spinal cord signal is within normal limits at all visualized
levels. The conus medullaris appears normal at L1. No abnormal
intradural enhancement. No dural thickening.

There is a mild degree of thoracic spine epidural lipomatosis, most
notable from T4-T5 through T7-T8. But this does not efface CSF from
the thecal sac.

Paraspinal and other soft tissues: Postoperative changes at the
cervicothoracic junction. Otherwise negative thoracic paraspinal
soft tissues. Negative visible thoracic and upper abdominal viscera.

Disc levels:

T1-T2: Reported on the cervical spine today.

T2-T3: Mild to moderate facet hypertrophy with mild to moderate left
T2 foraminal stenosis.

T3-T4: Moderate facet hypertrophy. No significant stenosis.

T4-T5: Mild to moderate facet hypertrophy. No stenosis.

T5-T6: Negative.

T6-T7: Negative.

T7-T8: Small central disc protrusion (series 20, image 23). Mild
posterior element hypertrophy. No significant stenosis.

T8-T9: Mild posterior element hypertrophy. No stenosis.

T9-T10: Mild to moderate posterior element hypertrophy. No stenosis.

T10-T11: Mild circumferential disc bulge and mild to moderate
posterior element hypertrophy. No stenosis.

T11-T12: Minimal disc bulge and posterior element hypertrophy. No
stenosis.

T12-L1: Negative.
IMPRESSION: 1. Prior cervicothoracic junction ACDF, reported with the cervical
spine MRI today. No acute osseous abnormality.

2. Minimal thoracic disc degeneration. Widespread thoracic facet and
ligament flavum hypertrophy, associated with moderate left T2 neural
foraminal stenosis. But no significant thoracic spinal stenosis,
despite a mild degree of thoracic epidural lipomatosis.

3. Normal thoracic spinal cord.

ADDENDUM:
Please see addendum on contemporary Lumbar MRI issue today regarding
nonenhancing signal abnormality in the conus medullaris, which is
also visible on this exam series 19, image 10

*** End of Addendum ***
FINDINGS: Limited cervical spine imaging:  Reported separately today.

Thoracic spine segmentation:  Appears to be normal.

Alignment:  Preserved thoracic kyphosis. No spondylolisthesis.

Vertebrae: Hardware susceptibility artifact at C7-T1 and T1-T2
related to ACDF which is new from [DATE] as reported on the
cervical study today. No marrow edema or evidence of acute osseous
abnormality. Background bone marrow signal appears within normal
limits.

Cord: Spinal cord signal is within normal limits at all visualized
levels. The conus medullaris appears normal at L1. No abnormal
intradural enhancement. No dural thickening.

There is a mild degree of thoracic spine epidural lipomatosis, most
notable from T4-T5 through T7-T8. But this does not efface CSF from
the thecal sac.

Paraspinal and other soft tissues: Postoperative changes at the
cervicothoracic junction. Otherwise negative thoracic paraspinal
soft tissues. Negative visible thoracic and upper abdominal viscera.

Disc levels:

T1-T2: Reported on the cervical spine today.

T2-T3: Mild to moderate facet hypertrophy with mild to moderate left
T2 foraminal stenosis.

T3-T4: Moderate facet hypertrophy. No significant stenosis.

T4-T5: Mild to moderate facet hypertrophy. No stenosis.

T5-T6: Negative.

T6-T7: Negative.

T7-T8: Small central disc protrusion (series 20, image 23). Mild
posterior element hypertrophy. No significant stenosis.

T8-T9: Mild posterior element hypertrophy. No stenosis.

T9-T10: Mild to moderate posterior element hypertrophy. No stenosis.

T10-T11: Mild circumferential disc bulge and mild to moderate
posterior element hypertrophy. No stenosis.

T11-T12: Minimal disc bulge and posterior element hypertrophy. No
stenosis.

T12-L1: Negative.
IMPRESSION: 1. Prior cervicothoracic junction ACDF, reported with the cervical
spine MRI today. No acute osseous abnormality.

2. Minimal thoracic disc degeneration. Widespread thoracic facet and
ligament flavum hypertrophy, associated with moderate left T2 neural
foraminal stenosis. But no significant thoracic spinal stenosis,
despite a mild degree of thoracic epidural lipomatosis.

3. Normal thoracic spinal cord.

## 2020-02-03 IMAGING — MR MR LUMBAR SPINE WO/W CM
5 of 7 series · 28 of 48 positions shown · IV contrast (gadavist)
Comparison: Thoracic spine MRI today reported separately. ZAVADIL
ZAVADIL Lumbar MRI [DATE]. Intraoperative lumbar fluoroscopy
[DATE].
COMPARISON: Thoracic spine MRI today reported separately. ZAVADIL
ZAVADIL Lumbar MRI [DATE]. Intraoperative lumbar fluoroscopy
[DATE].

Addendum:
CLINICAL DATA: 69-year-old female with ataxia. Urinary
incontinence. Numbness in the bilateral pelvis, buttocks, lower
extremities. Status post lumbar spine surgery last month but
persistent numbness.

EXAM:
MRI LUMBAR SPINE WITHOUT AND WITH CONTRAST
TECHNIQUE: Multiplanar and multiecho pulse sequences of the lumbar spine were
obtained without and with intravenous contrast.
CONTRAST:  6mL GADAVIST GADOBUTROL 1 MMOL/ML IV SOLN in conjunction
with contrast enhanced imaging of the cervical and thoracic spine
reported separately.

[Series 16: T2 · sagittal · 4.0mm · 1.02mm/px · 5 of 15 slices shown (1 of 2)]
[im 1/15]
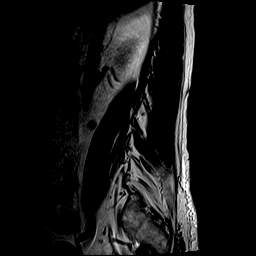
[im 4/15]
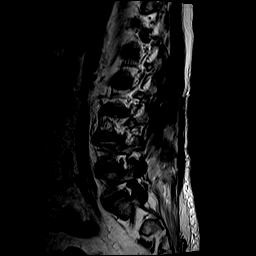
[im 8/15]
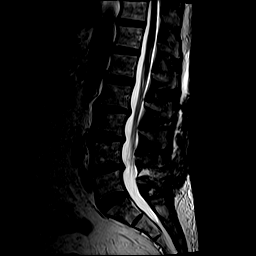
[im 11/15]
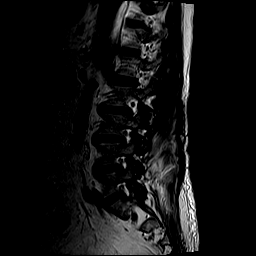
[im 15/15]
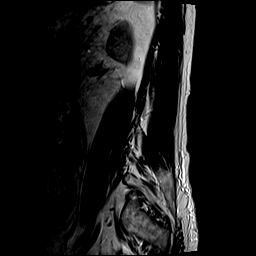

[Series 17: T1 · sagittal · 4.0mm · 1.02mm/px · 5 of 15 slices shown (1 of 2)]
[im 1/15]
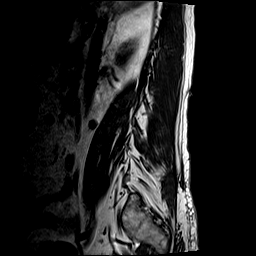
[im 4/15]
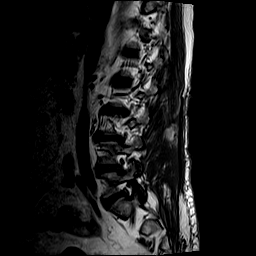
[im 8/15]
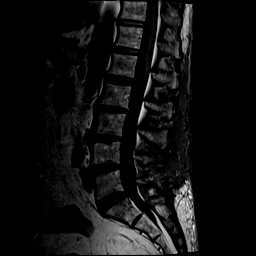
[im 11/15]
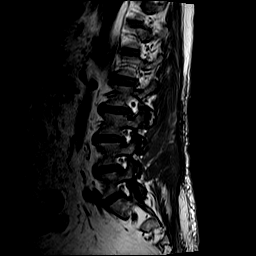
[im 15/15]
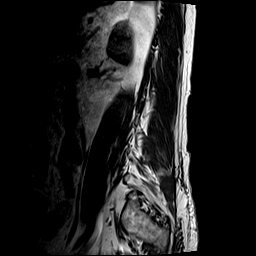

[Series 19: T2 · axial · 4.0mm · 0.78mm/px · z∈[-431,-237]mm · 8 of 34 slices shown (2 of 2)]
[im 1/34]
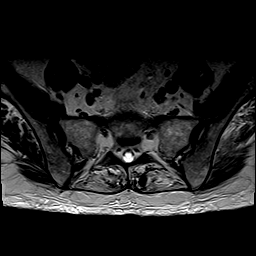
[im 4/34]
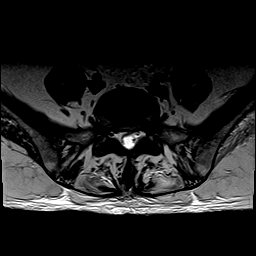
[im 12/34]
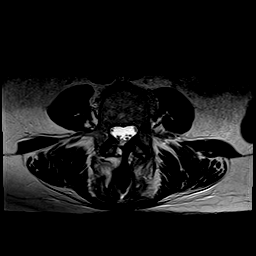
[im 15/34]
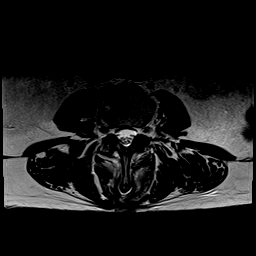
[im 19/34]
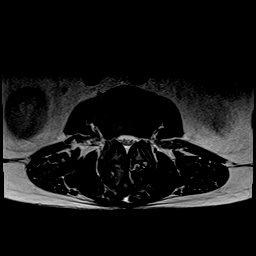
[im 23/34]
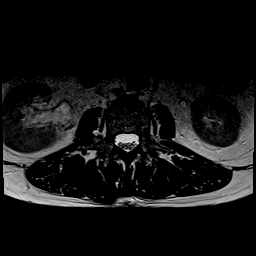
[im 30/34]
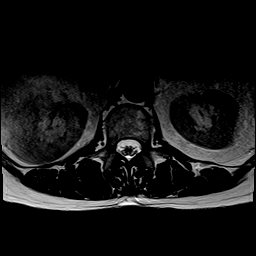
[im 34/34]
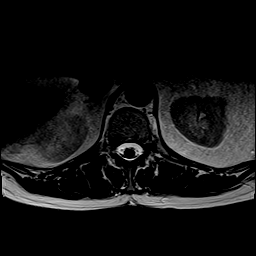

[Series 20: T1 · axial · 4.0mm · 0.39mm/px · z∈[-431,-237]mm · 8 of 34 slices shown (2 of 2)]
[im 1/34]
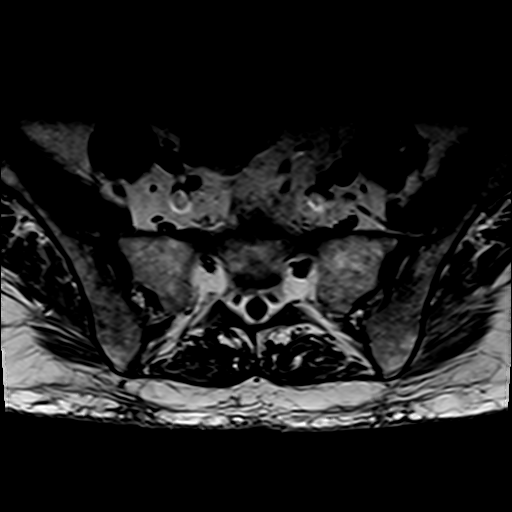
[im 4/34]
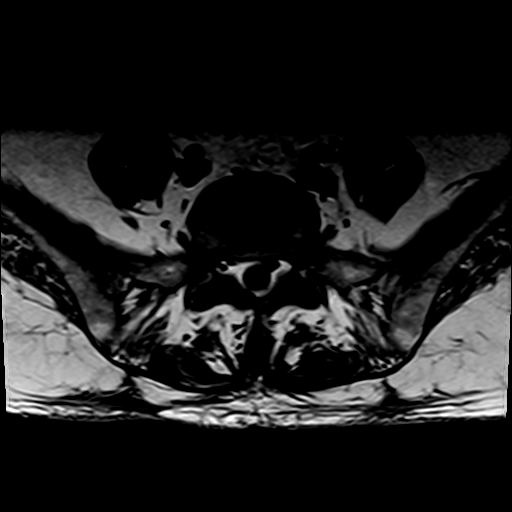
[im 12/34]
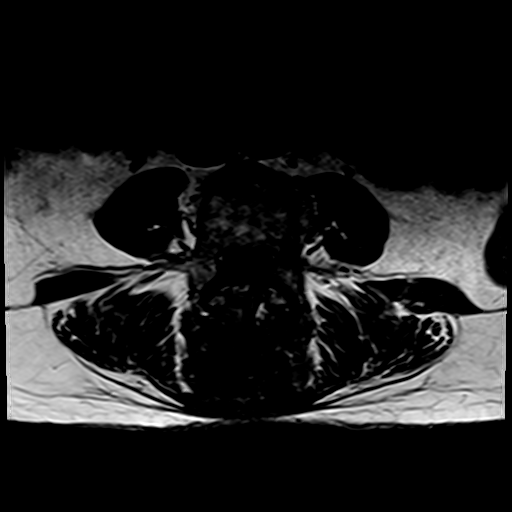
[im 15/34]
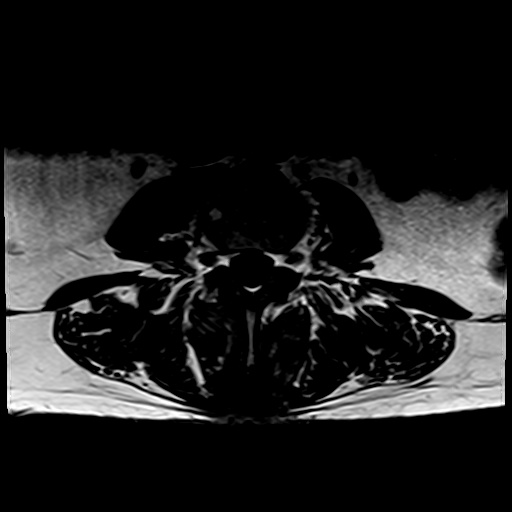
[im 19/34]
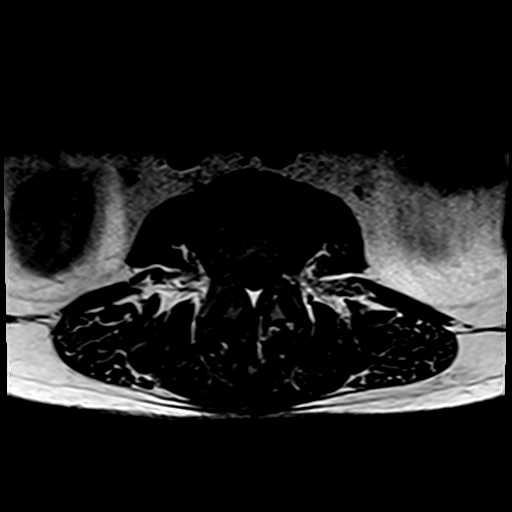
[im 23/34]
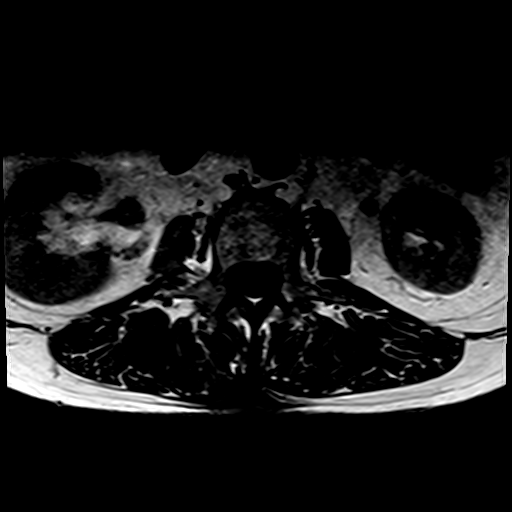
[im 30/34]
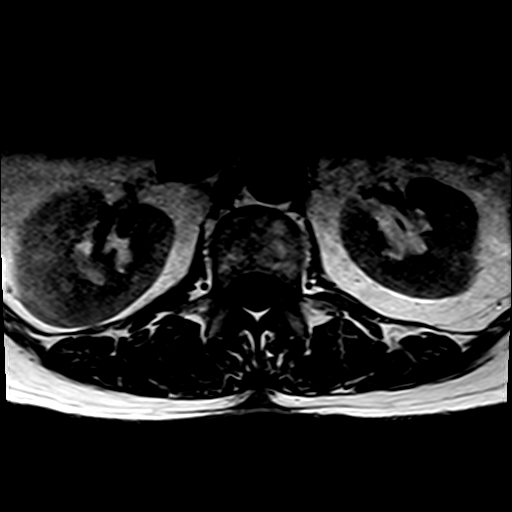
[im 34/34]
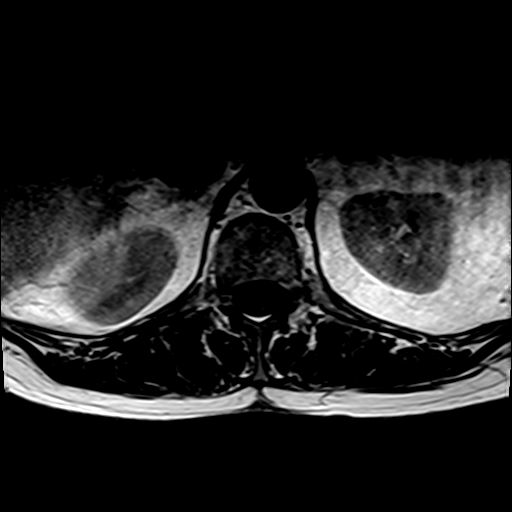

[Series 21: T1 fat-sat post-contrast · sagittal · 4.0mm · 1.02mm/px · 2 of 15 slices shown]
[im 1/15]
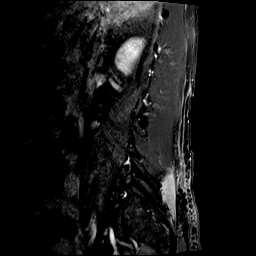
[im 5/15]
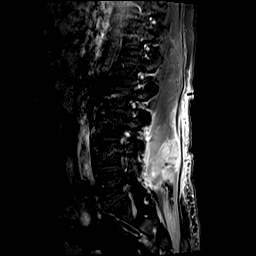

[28 of 48 positions shown; findings below may reference images not displayed]

FINDINGS: Segmentation: Normal, concordant with the thoracic spine numbering
today in the same numbering used on [DATE].

Alignment:  Stable lumbar lordosis. No spondylolisthesis.

Vertebrae: Postoperative changes detailed below. No marrow edema or
evidence of acute osseous abnormality. Intact visible sacrum and SI
joints. Normal background bone marrow signal.

Conus medullaris and cauda equina: Conus extends to the L1 level. No
lower spinal cord or conus signal abnormality. Faint linear
enhancement of the left S1 nerve roots throughout the cauda equina
(series 21, image 8 and series 22, image 28, appears to be
inflammatory. Other cauda equina nerve roots appear normal, with no
other abnormal intradural enhancement. No dural thickening.

Paraspinal and other soft tissues: Postoperative changes with
enhancing lumbar paraspinal granulation tissue from L2 through S1.
Associated bilateral erector spinae muscle edema or denervation and
enhancement tracking down to the sacrum. No postoperative fluid
collection.

Negative visible abdominal viscera.

Disc levels:

T12-L1:  Reported with the thoracic spine today.

L1-L2:  Negative.

L2-L3: Mild disc space loss with circumferential disc bulge. Mild to
moderate posterior element hypertrophy. Stable borderline to mild
spinal stenosis. No convincing foraminal stenosis.

L3-L4: Disc space loss with circumferential disc bulge. Interval
right laminectomy (series 19, image 21). Resolved spinal stenosis,
and no convincing lateral recess stenosis. Moderate residual
posterior element hypertrophy. Stable mild left L3 foraminal
stenosis. Right L3 foraminal patency has improved, without
convincing stenosis.

L4-L5: Chronic circumferential disc bulge with residual broad-based
right subarticular component of disc (series 19, image 25).
Postoperative changes to the lamina greater on the right. Resolved
spinal stenosis and right lateral recess stenosis. Moderate residual
posterior element hypertrophy. Mild bilateral L4 foraminal stenosis
appears stable.

L5-S1: Chronic broad-based right subarticular disc bulge or
protrusion with moderate facet hypertrophy. No spinal stenosis.
Probable conjoined right L5 and S1 nerve roots (normal variant).
Superimposed mild to moderate right lateral recess stenosis appears
stable. No spinal stenosis. Stable mild to moderate right L5
foraminal stenosis.
IMPRESSION: 1. Postoperative changes since [DATE]-L4 and L4-L5. Resolved
spinal stenosis at both levels and no convincing lateral recess
stenosis. Improved right L3 neural foraminal patency, with otherwise
stable mild L3 and L4 foraminal stenosis.

2. Other lumbar levels are stable, including probable conjoined
right L5-S1 nerve roots which contributes to moderate right neural
foraminal and right lateral recess stenosis at L5-S1.

3. Faint linear enhancement of the left S1 nerve roots throughout
the cauda equina is felt to be postoperative/inflammatory. No other
cauda equina abnormality.

4. Expected postoperative changes to the paraspinal soft tissues.

ADDENDUM:
This case was reviewed by ZAVADIL. ZAVADIL and ZAVADIL.
And I concur that since the [DATE] MRI there has been volume
loss in the conus medullaris. The [REDACTED] MRI demonstrated mild
diffuse T2 hyperintensity without enhancement in [DATE]
image 3). And signal there has now evolved to a small residual area
of more discrete T2/STIR hyperintensity (series 18, image 8 on the
current exam) dorsally with volume loss, no enhancement.
CONCLUSION: Nonenhancing signal changes and volume loss in the conus medullaris
since [DATE] suspicious for a conus infarct.

*** End of Addendum ***
FINDINGS: Segmentation: Normal, concordant with the thoracic spine numbering
today in the same numbering used on [DATE].

Alignment:  Stable lumbar lordosis. No spondylolisthesis.

Vertebrae: Postoperative changes detailed below. No marrow edema or
evidence of acute osseous abnormality. Intact visible sacrum and SI
joints. Normal background bone marrow signal.

Conus medullaris and cauda equina: Conus extends to the L1 level. No
lower spinal cord or conus signal abnormality. Faint linear
enhancement of the left S1 nerve roots throughout the cauda equina
(series 21, image 8 and series 22, image 28, appears to be
inflammatory. Other cauda equina nerve roots appear normal, with no
other abnormal intradural enhancement. No dural thickening.

Paraspinal and other soft tissues: Postoperative changes with
enhancing lumbar paraspinal granulation tissue from L2 through S1.
Associated bilateral erector spinae muscle edema or denervation and
enhancement tracking down to the sacrum. No postoperative fluid
collection.

Negative visible abdominal viscera.

Disc levels:

T12-L1:  Reported with the thoracic spine today.

L1-L2:  Negative.

L2-L3: Mild disc space loss with circumferential disc bulge. Mild to
moderate posterior element hypertrophy. Stable borderline to mild
spinal stenosis. No convincing foraminal stenosis.

L3-L4: Disc space loss with circumferential disc bulge. Interval
right laminectomy (series 19, image 21). Resolved spinal stenosis,
and no convincing lateral recess stenosis. Moderate residual
posterior element hypertrophy. Stable mild left L3 foraminal
stenosis. Right L3 foraminal patency has improved, without
convincing stenosis.

L4-L5: Chronic circumferential disc bulge with residual broad-based
right subarticular component of disc (series 19, image 25).
Postoperative changes to the lamina greater on the right. Resolved
spinal stenosis and right lateral recess stenosis. Moderate residual
posterior element hypertrophy. Mild bilateral L4 foraminal stenosis
appears stable.

L5-S1: Chronic broad-based right subarticular disc bulge or
protrusion with moderate facet hypertrophy. No spinal stenosis.
Probable conjoined right L5 and S1 nerve roots (normal variant).
Superimposed mild to moderate right lateral recess stenosis appears
stable. No spinal stenosis. Stable mild to moderate right L5
foraminal stenosis.
IMPRESSION: 1. Postoperative changes since [DATE]-L4 and L4-L5. Resolved
spinal stenosis at both levels and no convincing lateral recess
stenosis. Improved right L3 neural foraminal patency, with otherwise
stable mild L3 and L4 foraminal stenosis.

2. Other lumbar levels are stable, including probable conjoined
right L5-S1 nerve roots which contributes to moderate right neural
foraminal and right lateral recess stenosis at L5-S1.

3. Faint linear enhancement of the left S1 nerve roots throughout
the cauda equina is felt to be postoperative/inflammatory. No other
cauda equina abnormality.

4. Expected postoperative changes to the paraspinal soft tissues.

## 2020-02-03 MED ORDER — ASCRIPTIN 325 MG PO TABS: 325.0000 mg | ORAL_TABLET | Freq: Every day | ORAL | 11 refills | Status: DC

## 2020-02-03 MED ORDER — GADOBUTROL 1 MMOL/ML IV SOLN
6.0000 mL | Freq: Once | INTRAVENOUS | Status: AC | PRN
  Administered 2020-02-03: 6 mL via INTRAVENOUS

## 2020-02-03 NOTE — Telephone Encounter (Signed)
IMPRESSION: 1. Chronic C3 through C7 fusion with suspected solid arthrodesis and no residual stenosis. Progressed facet arthropathy on the right at the adjacent C2-C3 segment, although possible developing facet ankylosis there now. Moderate right C3 foraminal stenosis appears stable.  2. New cervicothoracic junction ACDF at C7-T1 and T1-T2 ACDF with improved thecal sac and foraminal patency at those levels.  3. No cervical spinal cord mass effect or signal abnormality.  IMPRESSION: 1. Prior cervicothoracic junction ACDF, reported with the cervical spine MRI today. No acute osseous abnormality.  2. Minimal thoracic disc degeneration. Widespread thoracic facet and ligament flavum hypertrophy, associated with moderate left T2 neural foraminal stenosis. But no significant thoracic spinal stenosis, despite a mild degree of thoracic epidural lipomatosis.  3. Normal thoracic spinal cord.  IMPRESSION: 1. Postoperative changes since March to L3-L4 and L4-L5. Resolved spinal stenosis at both levels and no convincing lateral recess stenosis. Improved right L3 neural foraminal patency, with otherwise stable mild L3 and L4 foraminal stenosis.  2. 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  3. Faint linear enhancement of the left S1 nerve roots throughout the cauda equina is felt to be postoperative/inflammatory. No other cauda equina abnormality.  I called patient, relayed above findings to patient, also reviewed MRIs with neuroradiologist Dr. Leta Baptist, compared to previous scan on December 11, 2019,  covered T11 through upper sacral region, coronal section cover T12 to lumbar region, multilevel degenerative changes, moderately severe spinal stenosis at L4-5, right lateral recess compression could affect right L5 nerve roots, mild spinal stenosis at  L3-4, moderate bilateral facet arthritis at L5-S1.  There is also evidence of T2 hyper density lesion on the sagittal, and coronal section, extending from lower T11 to conus medullaris, no contrast enhancement, evidence of lower thoracic swelling, which has improved on repeat MRI lumbar on February 02, 2020  Differentiation diagnosis includes spinal cord infarction, no evidence of intrinsic mass, vascular malformation  Proceed with CT angiogram of aorta,  Aspirin 325 mg daily,  Echocardiogram,  Laboratory evaluations  Keep EMG/NCS in May 2021

## 2020-02-04 ENCOUNTER — Other Ambulatory Visit: Payer: Self-pay | Admitting: *Deleted

## 2020-02-04 ENCOUNTER — Other Ambulatory Visit (INDEPENDENT_AMBULATORY_CARE_PROVIDER_SITE_OTHER): Payer: Self-pay

## 2020-02-04 ENCOUNTER — Telehealth: Payer: Self-pay | Admitting: Neurology

## 2020-02-04 DIAGNOSIS — G9511 Acute infarction of spinal cord (embolic) (nonembolic): Secondary | ICD-10-CM

## 2020-02-04 DIAGNOSIS — Z0289 Encounter for other administrative examinations: Secondary | ICD-10-CM

## 2020-02-04 NOTE — Telephone Encounter (Signed)
Pt called wanting to know when the aspirin will be called in for her to the [facility] so that she can pick it up today. Please advise.

## 2020-02-04 NOTE — Telephone Encounter (Signed)
I have spoken to the patient. She is planning to purchase her aspirin 325mg , one tablet daily OTC. Dr. Krista Blue had directed her to take this dosage. It has also been added to her medication list.

## 2020-02-04 NOTE — Telephone Encounter (Signed)
I returned the call to the patient. She is just going to come to our office to have her labs drawn. She does not want there to be a delay in the results getting back to Dr. Krista Blue.

## 2020-02-04 NOTE — Telephone Encounter (Signed)
For the CT's the patient is scheduled at [facility] for Tuesday 02/09/20 to arrive at 12:30 pm. Patient is aware of time and day. She also has their number of [phone] incase she needed to r/s for any reason.  She also stated that Dr. Krista Blue mention getting more blood work done. She is wondering if she can get those done at [facility] as well the day of the CT's?? Please advise?

## 2020-02-04 NOTE — Telephone Encounter (Signed)
Left message requesting a call back to discuss her lab work.

## 2020-02-09 DIAGNOSIS — R200 Anesthesia of skin: Secondary | ICD-10-CM | POA: Diagnosis not present

## 2020-02-09 DIAGNOSIS — I700 Atherosclerosis of aorta: Secondary | ICD-10-CM | POA: Diagnosis not present

## 2020-02-09 DIAGNOSIS — G9511 Acute infarction of spinal cord (embolic) (nonembolic): Secondary | ICD-10-CM | POA: Diagnosis not present

## 2020-02-11 ENCOUNTER — Telehealth: Payer: Self-pay | Admitting: Neurology

## 2020-02-11 DIAGNOSIS — G9511 Acute infarction of spinal cord (embolic) (nonembolic): Secondary | ICD-10-CM

## 2020-02-11 NOTE — Telephone Encounter (Signed)
I have called patient   CT angiogram of the chest, abdomen, pelvis report from [facility] radiology reviewed on Feb 09, 2020,  There is atherosclerotic changes of the abdominal aorta without evidence of aneurysm, dissection, posterior surgical decompressive change of the lower lumbar spine, large amount of stool throughout the colon,  She will keep EMG/NCS appointment on May 26th.  I will refer her to [facility] neurology

## 2020-02-15 DIAGNOSIS — Z9181 History of falling: Secondary | ICD-10-CM | POA: Diagnosis not present

## 2020-02-15 DIAGNOSIS — D649 Anemia, unspecified: Secondary | ICD-10-CM | POA: Diagnosis not present

## 2020-02-15 DIAGNOSIS — K5900 Constipation, unspecified: Secondary | ICD-10-CM | POA: Diagnosis not present

## 2020-02-15 DIAGNOSIS — E785 Hyperlipidemia, unspecified: Secondary | ICD-10-CM | POA: Diagnosis not present

## 2020-02-15 DIAGNOSIS — Z79891 Long term (current) use of opiate analgesic: Secondary | ICD-10-CM | POA: Diagnosis not present

## 2020-02-15 DIAGNOSIS — Z13820 Encounter for screening for osteoporosis: Secondary | ICD-10-CM | POA: Diagnosis not present

## 2020-02-15 DIAGNOSIS — G629 Polyneuropathy, unspecified: Secondary | ICD-10-CM | POA: Diagnosis not present

## 2020-02-15 DIAGNOSIS — M48061 Spinal stenosis, lumbar region without neurogenic claudication: Secondary | ICD-10-CM | POA: Diagnosis not present

## 2020-02-15 DIAGNOSIS — Z4789 Encounter for other orthopedic aftercare: Secondary | ICD-10-CM | POA: Diagnosis not present

## 2020-02-15 DIAGNOSIS — Z981 Arthrodesis status: Secondary | ICD-10-CM | POA: Diagnosis not present

## 2020-02-15 DIAGNOSIS — M810 Age-related osteoporosis without current pathological fracture: Secondary | ICD-10-CM | POA: Diagnosis not present

## 2020-02-15 DIAGNOSIS — M5416 Radiculopathy, lumbar region: Secondary | ICD-10-CM | POA: Diagnosis not present

## 2020-02-15 DIAGNOSIS — R339 Retention of urine, unspecified: Secondary | ICD-10-CM | POA: Diagnosis not present

## 2020-02-15 DIAGNOSIS — E2839 Other primary ovarian failure: Secondary | ICD-10-CM | POA: Diagnosis not present

## 2020-02-16 LAB — CBC WITH DIFFERENTIAL
Basophils Absolute: 0 10*3/uL (ref 0.0–0.2)
Basos: 1 %
EOS (ABSOLUTE): 0.1 10*3/uL (ref 0.0–0.4)
Eos: 2 %
Hematocrit: 38.1 % (ref 34.0–46.6)
Hemoglobin: 12.8 g/dL (ref 11.1–15.9)
Immature Grans (Abs): 0 10*3/uL (ref 0.0–0.1)
Immature Granulocytes: 0 %
Lymphocytes Absolute: 1.6 10*3/uL (ref 0.7–3.1)
Lymphs: 32 %
MCH: 30.2 pg (ref 26.6–33.0)
MCHC: 33.6 g/dL (ref 31.5–35.7)
MCV: 90 fL (ref 79–97)
Monocytes Absolute: 0.4 10*3/uL (ref 0.1–0.9)
Monocytes: 9 %
Neutrophils Absolute: 2.9 10*3/uL (ref 1.4–7.0)
Neutrophils: 56 %
RBC: 4.24 x10E6/uL (ref 3.77–5.28)
RDW: 14.2 % (ref 11.7–15.4)
WBC: 5 10*3/uL (ref 3.4–10.8)

## 2020-02-16 LAB — HYPERCOAGULABLE PANEL, COMPREHENSIVE
APTT: 26.5 s
AT III Act/Nor PPP Chro: 134 %
Act. Prt C Resist w/FV Defic.: 2.9 ratio
Anticardiolipin Ab, IgG: <10 [GPL'U]
Anticardiolipin Ab, IgM: <10 [MPL'U]
Beta-2 Glycoprotein I, IgA: <10 SAU
Beta-2 Glycoprotein I, IgG: <10 SGU
Beta-2 Glycoprotein I, IgM: 12 SMU
DRVVT Screen Seconds: 34 s
Factor VII Antigen**: 124 %
Factor VIII Activity: 128 %
Hexagonal Phospholipid Neutral: 1 s
Homocysteine: 13 umol/L
Prot C Ag Act/Nor PPP Imm: 124 %
Prot S Ag Act/Nor PPP Imm: 101 %
Protein C Ag/FVII Ag Ratio**: 1 ratio
Protein S Ag/FVII Ag Ratio**: 0.8 ratio

## 2020-02-16 LAB — COMPREHENSIVE METABOLIC PANEL
ALT: 29 IU/L (ref 0–32)
AST: 37 IU/L (ref 0–40)
Albumin/Globulin Ratio: 2.2 (ref 1.2–2.2)
Albumin: 4.4 g/dL (ref 3.8–4.8)
Alkaline Phosphatase: 91 IU/L (ref 39–117)
BUN/Creatinine Ratio: 15 (ref 12–28)
BUN: 10 mg/dL (ref 8–27)
Bilirubin Total: 0.3 mg/dL (ref 0.0–1.2)
CO2: 27 mmol/L (ref 20–29)
Calcium: 10.1 mg/dL (ref 8.7–10.3)
Chloride: 101 mmol/L (ref 96–106)
Creatinine, Ser: 0.67 mg/dL (ref 0.57–1.00)
GFR calc Af Amer: 104 mL/min/{1.73_m2} (ref >59)
GFR calc non Af Amer: 91 mL/min/{1.73_m2} (ref >59)
Globulin, Total: 2 g/dL (ref 1.5–4.5)
Glucose: 114 mg/dL — ABNORMAL HIGH (ref 65–99)
Potassium: 4.1 mmol/L (ref 3.5–5.2)
Sodium: 145 mmol/L — ABNORMAL HIGH (ref 134–144)
Total Protein: 6.4 g/dL (ref 6.0–8.5)

## 2020-02-16 LAB — VITAMIN B12: Vitamin B-12: 694 pg/mL (ref 232–1245)

## 2020-02-16 LAB — ANA W/REFLEX IF POSITIVE: Anti Nuclear Antibody (ANA): NEGATIVE

## 2020-02-16 LAB — B. BURGDORFI ANTIBODIES: Lyme IgG/IgM Ab: <0.91 {ISR} (ref 0.00–0.90)

## 2020-02-16 LAB — HIV ANTIBODY (ROUTINE TESTING W REFLEX): HIV Screen 4th Generation wRfx: NONREACTIVE

## 2020-02-16 LAB — SEDIMENTATION RATE: Sed Rate: 8 mm/hr (ref 0–40)

## 2020-02-16 LAB — RPR: RPR Ser Ql: NONREACTIVE

## 2020-02-16 LAB — ANGIOTENSIN CONVERTING ENZYME: Angio Convert Enzyme: 60 U/L (ref 14–82)

## 2020-02-16 LAB — TSH: TSH: 1.69 u[IU]/mL (ref 0.450–4.500)

## 2020-02-16 LAB — C-REACTIVE PROTEIN: CRP: 3 mg/L (ref 0–10)

## 2020-02-19 ENCOUNTER — Telehealth: Payer: Self-pay | Admitting: Neurology

## 2020-02-19 NOTE — Telephone Encounter (Signed)
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 Perrin Smack, [facility] stroke [facility] at [facility],  Orson Ape. Powers, MD Professor Stroke and Vascular Neurology Department Chair 2007 - 2018  Clinical Interests Stroke  Phone Number [phone]  Address [address]  [id number]  [place], [place] [address]   Make a Referral [email or web address]

## 2020-02-23 NOTE — Telephone Encounter (Signed)
Called patient and relayed to her that she will need to pick up her MRI CDs to take with her to Dr. Danley Danker Telephone [phone]- fax [phone].

## 2020-02-29 ENCOUNTER — Encounter: Payer: PPO | Admitting: Neurology

## 2020-03-02 ENCOUNTER — Ambulatory Visit: Payer: PPO | Admitting: Neurology

## 2020-03-02 ENCOUNTER — Ambulatory Visit (INDEPENDENT_AMBULATORY_CARE_PROVIDER_SITE_OTHER): Payer: PPO | Admitting: Neurology

## 2020-03-02 ENCOUNTER — Other Ambulatory Visit: Payer: Self-pay

## 2020-03-02 DIAGNOSIS — R269 Unspecified abnormalities of gait and mobility: Secondary | ICD-10-CM

## 2020-03-02 DIAGNOSIS — G5793 Unspecified mononeuropathy of bilateral lower limbs: Secondary | ICD-10-CM

## 2020-03-02 DIAGNOSIS — G9581 Conus medullaris syndrome: Secondary | ICD-10-CM | POA: Diagnosis not present

## 2020-03-02 DIAGNOSIS — R159 Full incontinence of feces: Secondary | ICD-10-CM | POA: Diagnosis not present

## 2020-03-02 DIAGNOSIS — R531 Weakness: Secondary | ICD-10-CM | POA: Diagnosis not present

## 2020-03-02 DIAGNOSIS — M792 Neuralgia and neuritis, unspecified: Secondary | ICD-10-CM

## 2020-03-02 DIAGNOSIS — R32 Unspecified urinary incontinence: Secondary | ICD-10-CM | POA: Diagnosis not present

## 2020-03-02 MED ORDER — ROSUVASTATIN CALCIUM 10 MG PO TABS: 10.0000 mg | ORAL_TABLET | Freq: Every day | ORAL | 4 refills | Status: DC

## 2020-03-02 MED ORDER — PREGABALIN 100 MG PO CAPS: 100.0000 mg | ORAL_CAPSULE | Freq: Three times a day (TID) | ORAL | 5 refills | Status: DC

## 2020-03-02 NOTE — Procedures (Signed)
Full Name: Beverly Wolfe Gender: Female MRN #: [id number] Date of Birth: 09/13/1951    Visit Date: 03/02/2020 09:56 Age: 69 Years Examining Physician: Marcial Pacas, MD  Referring Physician: Marcial Pacas, MD Height: 5 feet 0 inch History:   69 year old female with sudden onset bilateral lower extremity pain, weakness, incontinence, MRI showed conus medullary T12 abnormal signal changes, persistent weakness despite lumbar decompression surgery, also had a history of cervical decompression surgery, and the right supinator decompression surgery in the past  Summary of the tests:  Nerve conduction study: Bilateral sural, superficial peroneal, right median, ulnar sensory responses were normal. Bilateral tibial, right peroneal to EDB, right ulnar motor responses were normal. Left peroneal to EDB motor response was absent. Right median motor responses showed normal distal latency, moderately decreased CMAP amplitude, with normal conduction velocity.  Electromyography: Selected needle examination was performed at bilateral lower extremity muscles, bilateral lumbosacral paraspinal muscles, right upper extremity muscles, right cervical paraspinal muscles.  There is evidence of chronic neuropathic changes involving bilateral tibialis anterior, peroneal longus, vastus lateralis, there was no spontaneous activity at bilateral lumbosacral paraspinal muscles. There is evidence of decreased activation involving left tibialis anterior, medial gastrocnemius, with mildly increased insertional activity but no spontaneous activity, which suggestive of upper motor neuron findings.  There is also evidence of chronic neuropathic changes involving all the upper extremity muscle tested,  Conclusion: This is an abnormal study. There is electrodiagnostic evidence of chronic bilateral lumbosacral radiculopathy, involving bilateral L4, 5, S1 myotomes. There is also findings consistent with upper motor neuron signs.  In addition, there is evidence of chronic right cervical radiculopathy, involving right C5, 6, 7, 8 myotomes.    ------------------------------- Marcial Pacas M.D. PhD  [facility] [address], [place] [address] Tel: [phone] Fax: [phone]  Verbal informed consent was obtained from the patient, patient was informed of potential risk of procedure, including bruising, bleeding, hematoma formation, infection, muscle weakness, muscle pain, numbness, among others.         [place]    Nerve / Sites Muscle Latency Ref. Amplitude Ref. Rel Amp Segments Distance Velocity Ref. Area    ms ms mV mV %  cm m/s m/s mVms  R Median - APB     Wrist APB 4.3 ?4.4 2.2 ?4.0 100 Wrist - APB 7   8.0     Upper arm APB 8.1  1.9  87.6 Upper arm - Wrist 19 49 ?49 6.9  R Ulnar - ADM     Wrist ADM 3.3 ?3.3 6.2 ?6.0 100 Wrist - ADM 7   14.6     B.Elbow ADM 6.8  6.1  98.5 B.Elbow - Wrist 18 51 ?49 15.3     A.Elbow ADM 8.8  4.7  77.1 A.Elbow - B.Elbow 10 51 ?49 10.0         A.Elbow - Wrist      L Peroneal - EDB     Ankle EDB NR ?6.5 NR ?2.0 NR Ankle - EDB 9   NR     Fib head EDB NR  NR  NR Fib head - Ankle 24 NR ?44 NR     Pop fossa EDB NR  NR  NR Pop fossa - Fib head 10 NR ?44 NR         Pop fossa - Ankle      R Peroneal - EDB     Ankle EDB 5.5 ?6.5 2.3 ?2.0 100 Ankle - EDB 9  9.1     Fib head EDB 10.3  2.0  85.2 Fib head - Ankle 25 52 ?44 9.1     Pop fossa EDB 12.5  1.9  94.4 Pop fossa - Fib head 10 46 ?44 9.0         Pop fossa - Ankle      L Tibial - AH     Ankle AH 4.2 ?5.8 8.0 ?4.0 100 Ankle - AH 9   16.7     Pop fossa AH 12.0  6.2  76.7 Pop fossa - Ankle 33 43 ?41 14.7  R Tibial - AH     Ankle AH 3.6 ?5.8 17.9 ?4.0 100 Ankle - AH 9   36.3     Pop fossa AH 10.5  14.8  82.7 Pop fossa - Ankle 33 48 ?41 33.0                   SNC    Nerve / Sites Rec. Site Peak Lat Ref.  Amp Ref. Segments Distance    ms ms V V  cm  L Sural - Ankle (Calf)     Calf Ankle 3.4 ?4.4 7 ?6  Calf - Ankle 14  R Sural - Ankle (Calf)     Calf Ankle 3.3 ?4.4 18 ?6 Calf - Ankle 14  L Superficial peroneal - Ankle     Lat leg Ankle 3.8 ?4.4 8 ?6 Lat leg - Ankle 14  R Superficial peroneal - Ankle     Lat leg Ankle 3.8 ?4.4 6 ?6 Lat leg - Ankle 14  R Median - Orthodromic (Dig II, Mid palm)     Dig II Wrist 3.0 ?3.4 18 ?10 Dig II - Wrist 13  R Ulnar - Orthodromic, (Dig V, Mid palm)     Dig V Wrist 2.7 ?3.1 22 ?5 Dig V - Wrist 31                 F  Wave    Nerve F Lat Ref.   ms ms  L Tibial - AH 44.6 ?56.0  R Tibial - AH 42.1 ?56.0  R Ulnar - ADM 29.9 ?32.0           EMG Summary Table    Spontaneous MUAP Recruitment  Muscle IA Fib PSW Fasc Other Amp Dur. Poly Pattern  L. Tibialis anterior Increased 1+ None None _______ Increased Increased 1+ Reduced  L. Tibialis posterior Increased None None None _______ decreased motor activation Normal  No Activity  L. Peroneus longus Increased None None None _______ Increased Increased 1+ Reduced  L. Gastrocnemius (Medial head) Increased None None None _______ decreased motor unit activatio Normal Normal No Activity  L. Vastus lateralis Increased None None None _______ Increased Increased 1+ Reduced  R. Tibialis anterior Increased None None None _______ Increased Increased 1+ Reduced  R. Tibialis posterior Increased None None None _______ Increased Increased Normal Reduced  R. Peroneus longus Increased None None None _______ Increased Increased 1+ Reduced  R. Gastrocnemius (Medial head) Increased None None None _______ Increased Increased 1+ Reduced  R. Vastus lateralis Increased None None None _______ Increased Increased 1+ Reduced  R. Lumbar paraspinals (mid) Normal None None None _______ Normal Normal Normal Normal  R. Lumbar paraspinals (low) Normal None None None _______ Normal Normal Normal Normal  L. Lumbar paraspinals (mid) Normal None None None _______ Normal Normal Normal Normal  L. Lumbar paraspinals (low) Normal None None None  _______ Normal Normal Normal Normal  R. First dorsal interosseous Increased None None None _______ Increased Increased 1+ Reduced  R. Pronator teres Increased None None None _______ Increased Increased 1+ Reduced  R. Extensor digitorum communis Increased None None None _______ Increased Increased 1+ Reduced  R. Brachioradialis Increased None None None _______ Increased Increased 1+ Reduced  R. Biceps brachii Increased None None None _______ Increased Increased Normal Reduced  R. Deltoid Increased None None None _______ Increased Increased 1+ Reduced  R. Triceps brachii Normal None None None _______ Normal Normal Normal Normal  R. Cervical paraspinals Normal None None None _______ Normal Normal Normal Normal

## 2020-03-02 NOTE — Progress Notes (Addendum)
PATIENT: Beverly Wolfe DOB: 13-Nov-1950  No chief complaint on file.    HISTORICAL  Beverly Wolfe is a 69 year old female, seen in request by her primary care physician Dr. Ernestene Kiel, for evaluation of gait abnormality, incontinence, initial evaluation was on February 02, 2020.  She is accompanied by her daughter Beverly Wolfe at visit.  I have reviewed and summarized the referring note from the referring physician.  She had past medical history of hypertension, hyperlipidemia, cervical decompression surgery twice, initial surgery was done at [facility] by orthopedic surgeon Dr. Ellender Hose in 2010, she presented with neck pain, radiating pain to left upper extremity, had a posterior fusion of C3-7, complains of occasionally flareup of neck pain, radiating pain to left upper extremity, arm weakness.  Has been followed up by orthopedic surgeon regularly, had  second cervical decompression of C7-T1 surgery by [facility] orthopedic surgeon Dr. Donivan Scull in November 2020 for progressive right hand weakness, numbness, recurrent neck pain radiating to right upper extremity, post surgery has helped her symptoms, at baseline, she is highly function, ambulate without any difficulty.  She does has residual right hand muscle atrophy, and weakness.  On December 10, 2019, she was able to go shopping at her baseline, unload her car, at 6 PM, she had sudden onset severe left lower extremity pain, starting at left lateral leg, spreading rostrally to involving left lateral thigh and, in the left foot, severe burning sensation lasting for 30 minutes, she was able to walk to her mailbox, 1 hour later, by 7 PM, she had a sudden onset of right lower extremity involvement, in a similar pattern, but seems to be a milder degree, starting from right lateral calf, extending to right thigh, and right foot, she took her hydrocodone rested for a while, while taking a shower, she noticed difficulty bearing weight, drying her hair, by 8  PM, she had such severe bilateral lower extremity pain, she called her son to bring her to local emergency room, it is difficult for her to sit still due to extreme bilateral lower extremity pain from bilateral hip done, when she is trying to get up changing position, she noticed bilateral lower extremity weakness, difficulty bearing weight  At night of March for 2021 at emergency room of [facility], she got CT lumbar spine, I personally reviewed the film, Multilevel lumbar degenerative changes, there is multifactorial spinal stenosis, most severe at L4-5 followed by L3-4 then L2-3. At L5-S1, there is narrowing of the subarticular lateral recesses and foramina right more than left. Certainly, the findings are severe enough to be symptomatic.  During her emergency room stay in, she develop urinary retention, went to bathroom multiple times without emptying her bladder required cath, she continue complains severe bilateral lower extremity pain, difficulty walking, bilateral lower extremity weakness.  She had MRI on December 11, 2019, I personally reviewed the film, covered T11 through upper sacral region, coronal section cover T12 to lumbar region, multilevel degenerative changes, moderately severe spinal stenosis at L4-5, right lateral recess compression could affect right L5 nerve roots, mild spinal stenosis at L3-4, moderate bilateral facet arthritis at L5-S1.   She underwent posterior decompression with laminectomy for decompression of cauda equina and nerve root at L4-5, L3-4 by Dr. Donivan Scull on December 16, 2019.  From reviewing Dr. Towanda Octave presurgical evaluation, patient was noted to have weakness involving bilateral foot dorsiflexion, and plantarflexion.  Surgery did help her low back pain, which is under reasonable control now, she went to rehab,  but still has persistent bilateral leg weakness, especially distal leg, gait abnormality.  She had a urinary retention at emergency room, now she has  urinary incontinence, barely any urge then urinary incontinence without warning signs, when she defecate, she could not feel it come out, she has paresthesia at her saddle area.  UPDATE Mar 02 2020: She is accompanied by her son at today's clinical visit, which showed evidence of chronic bilateral lumbosacral radiculopathy, left worse than right, in addition, there is evidence of upper motor neuron damage, she has decreased activation at the left tibialis posterior, medial gastrocnemius muscle on the left leg, she also has evidence of chronic right cervical radiculopathy.  She complains of significant neuropathic pain despite taking gabapentin 300 mg twice daily, higher dose, she complains of depression, her pain involving bilateral lower extremity, saddle region, previously responded better to Lyrica, tried Cymbalta could not tolerate the side effect of confusion, GI side effect  We also personally reviewed MRI of lumbar spine in March 2021, compared with her repeat MRI lumbar spine in April 2021,  MRI of lumbar spine on December 11, 2019,  covered T11 through upper sacral region, coronal section cover T12 to lumbar region, multilevel degenerative changes, moderately severe spinal stenosis at L4-5, right lateral recess compression could affect right L5 nerve roots, mild spinal stenosis at L3-4, moderate bilateral facet arthritis at L5-S1.  There is also evidence of T2 hyper density lesion on the sagittal, and coronal section, extending from lower T11 to conus medullaris, no contrast enhancement, evidence of lower thoracic swelling, which has improved on repeat MRI lumbar on February 02, 2020  MRI thoracic spine showed no significant canal stenosis, lower thoracic T12 spine signal changes,  MRI of cervical spine showed chronic C3-C7 fusion, no significant canal stenosis.  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 Prince Rome,   REVIEW OF SYSTEMS: Full 14 system review of systems performed and notable only for as above All other review of systems were negative.  ALLERGIES: Allergies  Allergen Reactions  . Cymbalta [Duloxetine Hcl] Nausea Only and Other (See Comments)    Shakiness  . Other     Sodium lauryl sulfate - causes mouth sores   . Percocet [Oxycodone-Acetaminophen] Itching  . Talwin [Pentazocine] Nausea And Vomiting and Other (See Comments)    Hallucinations  . Bactrim [Sulfamethoxazole-Trimethoprim] Itching and Rash  . Sulfa Antibiotics Itching and Rash    HOME MEDICATIONS: Current Outpatient Medications  Medication Sig Dispense Refill  . acetaminophen (TYLENOL) 500 MG tablet Take 1,000 mg by mouth every 8 (eight) hours as needed.    Marland Kitchen aspirin 325 MG tablet Take 325  mg by mouth daily.    . bethanechol (URECHOLINE) 50 MG tablet Take 50 mg by mouth in the morning and at bedtime.    Marland Kitchen CALCIUM PO Take 1,000 mg by mouth daily.    . Cholecalciferol (VITAMIN D3) 1000 units CAPS Take by mouth daily.    . Coenzyme Q10 (COQ10 PO) Take 100 mg by mouth daily.    Marland Kitchen  gabapentin (NEURONTIN) 300 MG capsule Take 300 mg by mouth 2 (two) times daily.     Marland Kitchen HYDROCHLOROTHIAZIDE PO Take 12.5 mg by mouth daily.     Marland Kitchen HYDROcodone-acetaminophen (NORCO/VICODIN) 5-325 MG tablet Take 1 tablet by mouth every 4 (four) hours as needed.    Marland Kitchen Lifitegrast (XIIDRA) 5 % SOLN Apply 1 drop to eye daily. Both eyes    . omega-3 acid ethyl esters (LOVAZA) 1 g capsule Take 2 g by mouth 2 (two) times daily.     . polyethylene glycol (MIRALAX / GLYCOLAX) 17 g packet Take 17 g by mouth daily.    . rosuvastatin (CRESTOR) 10 MG tablet Take 10 mg by mouth daily.    . SENNA PO Take 3.6 mg by mouth at bedtime.    . tamsulosin (FLOMAX) 0.4 MG CAPS capsule Take 0.4 mg by mouth daily.    . traMADol (ULTRAM) 50 MG tablet Take 50 mg by mouth every 6 (six) hours as needed.     No current facility-administered medications for this visit.    PAST MEDICAL HISTORY: Past Medical History:  Diagnosis Date  . Constipation   . High blood pressure   . Hypercholesteremia   . Numbness   . Spinal stenosis   . Urinary incontinence     PAST SURGICAL HISTORY: Past Surgical History:  Procedure Laterality Date  . APPENDECTOMY  03/11/1979  . BREAST BIOPSY Left 08/13/1989  . BREAST BIOPSY Left 10/05/1998  . BUNIONECTOMY  02/04/1986  . CERVICAL FUSION  2010, 2020  . GYNECOLOGIC CRYOSURGERY    . OTHER SURGICAL HISTORY  12/14/2014   Right arm radial nerve release  . POSTERIOR LAMINECTOMY / DECOMPRESSION LUMBAR SPINE     L3-5  . right radial nerve release    . TOE SURGERY Right 01/24/2006  . TUBAL LIGATION  03/11/1979  . ULNAR NERVE TRANSPOSITION Left 08/31/1987  . UMBILICAL HERNIA REPAIR  01/20/2013    FAMILY HISTORY: Family History  Problem Relation Age of Onset  . Allergic rhinitis Mother   . Congestive Heart Failure Mother   . Emphysema Mother   . Hypertension Mother   . Hypercholesterolemia Mother   . Anxiety disorder Mother   . Cancer - Other Father        Oral Cancer  . Stroke  Father   . Benign prostatic hyperplasia Father   . COPD Sister   . Diabetes Maternal Aunt   . Diabetes Maternal Grandmother     SOCIAL HISTORY: Social History   Socioeconomic History  . Marital status: Widowed    Spouse name: Not on file  . Number of children: 2  . Years of education: 14 years  . Highest education level: Not on file  Occupational History  . Occupation: Retired  Tobacco Use  . Smoking status: Never Smoker  . Smokeless tobacco: Never Used  Substance and Sexual Activity  . Alcohol use: Not Currently  . Drug use: Never  . Sexual activity: Not on file  Other Topics Concern  . Not on file  Social History Narrative   Lives at home alone.  Right-handed.   One cup caffeine per day.   Social Determinants of Health   Financial Resource Strain:   . Difficulty of Paying Living Expenses:   Food Insecurity:   . Worried About Charity fundraiser in the Last Year:   . Arboriculturist in the Last Year:   Transportation Needs:   . Film/video editor (Medical):   Marland Kitchen Lack of Transportation (Non-Medical):   Physical Activity:   . Days of Exercise per Week:   . Minutes of Exercise per Session:   Stress:   . Feeling of Stress :   Social Connections:   . Frequency of Communication with Friends and Family:   . Frequency of Social Gatherings with Friends and Family:   . Attends Religious Services:   . Active Member of Clubs or Organizations:   . Attends Archivist Meetings:   Marland Kitchen Marital Status:   Intimate Partner Violence:   . Fear of Current or Ex-Partner:   . Emotionally Abused:   Marland Kitchen Physically Abused:   . Sexually Abused:      PHYSICAL EXAM   There were no vitals filed for this visit.  Not recorded      There is no height or weight on file to calculate BMI.  PHYSICAL EXAMNIATION:  Gen: NAD, conversant, well nourised, well groomed                     Cardiovascular: Regular rate rhythm, no peripheral edema, warm, nontender. Eyes:  Conjunctivae clear without exudates or hemorrhage Neck: Supple, no carotid bruits. Pulmonary: Clear to auscultation bilaterally   NEUROLOGICAL EXAM:  MENTAL STATUS: Speech:    Speech is normal; fluent and spontaneous with normal comprehension.  Cognition:     Orientation to time, place and person     Normal recent and remote memory     Normal Attention span and concentration     Normal Language, naming, repeating,spontaneous speech     Fund of knowledge   CRANIAL NERVES: CN II: Visual fields are full to confrontation. Pupils are round equal and briskly reactive to light. CN III, IV, VI: extraocular movement are normal. No ptosis. CN V: Facial sensation is intact to light touch CN VII: Face is symmetric with normal eye closure  CN VIII: Hearing is normal to causal conversation. CN IX, X: Phonation is normal. CN XI: Head turning and shoulder shrug are intact  MOTOR: She has right forearm and right intrinsic hand muscle atrophy, had a history of right radial decompression surgery with diagnosis of supinator compression, proximal upper extremity muscle strength is normal, R/L pronation 4/4-, supranation 4/5-, wrist extension 4+/5, wrist flexion4/5, grip4/5, finger abduction4/4+  Lower extremity normal muscle tone, muscle strength (R/L), hip flexion 5/5, hip adduction 5/5, hip abduction 5/5 -, knee extension 5/5, knee flexion 5/4, ankle dorsiflexion 4/4 -, ankle plantarflexion 4/3, ankle inversion 4/4, eversion 4/3   REFLEXES: Reflexes are 2+ and symmetric at the biceps, triceps, 3/3 knees, and absent at ankles. Plantar responses are flexor on right, mute on left  SENSORY: Length dependent decreased to light touch, vibratory sensation, pinprick to mid shin level  COORDINATION: There is no trunk or limb dysmetria noted.  GAIT/STANCE: She needs push-up to get up from seated position, unsteady, dragging her left leg more  DIAGNOSTIC DATA (LABS, IMAGING, TESTING) - I reviewed  patient records, labs, notes, testing and imaging myself where available.   ASSESSMENT AND PLAN  Beverly Wolfe is a 69  y.o. female    History of cervical radiculopathy status post cervical decompression surgery in the past, Presented with sudden onset left lower extremity followed by right lower extremity extreme pain and weakness, sensory loss, urinary retention, bowel incontinence,  Status post lumbar L4-5, L3-4 decompression surgery on December 14, 2019.  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 Prince Rome for evaluation  Neuropathic pain  Under suboptimal control with gabapentin  Will try Lyrica 100 mg 3 times a day   Marcial Pacas, M.D. Ph.D.  [facility] [address] [place], [place] [address] Ph: [phone] Fax: [phone]  CC: Ernestene Kiel, MD  [facility] neurology department evaluation of March 10, 2020, atypical for spinal stroke based on radiographic distribution, however she does have vascular risk factor of hypertension, atherosclerosis, and the posterior surgical embolic process, the spinal cord lesion may also represent immune mediated myelitis, which sometimes may be associated with TB hepatitis, less likely demyelinating although cannot excluded, will also to be evaluated for sarcoidosis, Shogry myelitis, paraneoplastic,  Work-up so far has included negative ANA, ESR C-reactive protein, HIV, RPR, Lyme, ACE, Normal or negative serum glucose, ENA, ANCA, rheumatoid factor, antiphospholipid panel, HTLV-1, 2 antibody, MO G IgG, MMR IgG, TB quanterferon, hepatitis B hepatitis C antibody,  Could not  rule out stroke, continue aspirin and statin  Patient was seen by third-year neurologist in training Dr. Darrol Angel, with attending physician Dr.Dujmovic-Basuroski  Will order lumbar puncture, check oligoclonal banding, cytology with flow cytometry, ACE besides the regular exam

## 2020-03-10 DIAGNOSIS — D485 Neoplasm of uncertain behavior of skin: Secondary | ICD-10-CM | POA: Diagnosis not present

## 2020-03-10 DIAGNOSIS — G959 Disease of spinal cord, unspecified: Secondary | ICD-10-CM | POA: Diagnosis not present

## 2020-03-10 DIAGNOSIS — R269 Unspecified abnormalities of gait and mobility: Secondary | ICD-10-CM | POA: Diagnosis not present

## 2020-03-10 DIAGNOSIS — L821 Other seborrheic keratosis: Secondary | ICD-10-CM | POA: Diagnosis not present

## 2020-03-15 DIAGNOSIS — M48062 Spinal stenosis, lumbar region with neurogenic claudication: Secondary | ICD-10-CM | POA: Diagnosis not present

## 2020-03-15 DIAGNOSIS — M5412 Radiculopathy, cervical region: Secondary | ICD-10-CM | POA: Diagnosis not present

## 2020-03-15 DIAGNOSIS — M47812 Spondylosis without myelopathy or radiculopathy, cervical region: Secondary | ICD-10-CM | POA: Diagnosis not present

## 2020-03-16 DIAGNOSIS — M5412 Radiculopathy, cervical region: Secondary | ICD-10-CM | POA: Diagnosis not present

## 2020-03-16 DIAGNOSIS — M48062 Spinal stenosis, lumbar region with neurogenic claudication: Secondary | ICD-10-CM | POA: Diagnosis not present

## 2020-03-17 ENCOUNTER — Telehealth: Payer: Self-pay | Admitting: Neurology

## 2020-03-17 DIAGNOSIS — R269 Unspecified abnormalities of gait and mobility: Secondary | ICD-10-CM

## 2020-03-17 NOTE — Telephone Encounter (Signed)
Yes, stimulator would be placed on legs, not on the spine.  Yes, I would like you to make referral for aquatic PT.  You would make the referral to [facility].  Their Address and Contact Information is: [address], [place],  [address].  Phone: [phone]  Fax: [phone]  Thank you!   Marylou Flesher

## 2020-03-18 DIAGNOSIS — N319 Neuromuscular dysfunction of bladder, unspecified: Secondary | ICD-10-CM | POA: Diagnosis not present

## 2020-03-18 DIAGNOSIS — R339 Retention of urine, unspecified: Secondary | ICD-10-CM | POA: Diagnosis not present

## 2020-03-21 NOTE — Telephone Encounter (Signed)
Noted Sent via Fax to information below .

## 2020-03-29 DIAGNOSIS — G9511 Acute infarction of spinal cord (embolic) (nonembolic): Secondary | ICD-10-CM | POA: Diagnosis not present

## 2020-03-29 DIAGNOSIS — G9581 Conus medullaris syndrome: Secondary | ICD-10-CM | POA: Diagnosis not present

## 2020-03-29 DIAGNOSIS — R2689 Other abnormalities of gait and mobility: Secondary | ICD-10-CM | POA: Diagnosis not present

## 2020-04-04 DIAGNOSIS — G9581 Conus medullaris syndrome: Secondary | ICD-10-CM | POA: Diagnosis not present

## 2020-04-04 DIAGNOSIS — R2689 Other abnormalities of gait and mobility: Secondary | ICD-10-CM | POA: Diagnosis not present

## 2020-04-04 DIAGNOSIS — G9511 Acute infarction of spinal cord (embolic) (nonembolic): Secondary | ICD-10-CM | POA: Diagnosis not present

## 2020-04-12 DIAGNOSIS — G9581 Conus medullaris syndrome: Secondary | ICD-10-CM | POA: Diagnosis not present

## 2020-04-12 DIAGNOSIS — G9511 Acute infarction of spinal cord (embolic) (nonembolic): Secondary | ICD-10-CM | POA: Diagnosis not present

## 2020-04-12 DIAGNOSIS — R2689 Other abnormalities of gait and mobility: Secondary | ICD-10-CM | POA: Diagnosis not present

## 2020-04-13 DIAGNOSIS — K5900 Constipation, unspecified: Secondary | ICD-10-CM | POA: Diagnosis not present

## 2020-04-13 DIAGNOSIS — I10 Essential (primary) hypertension: Secondary | ICD-10-CM | POA: Diagnosis not present

## 2020-04-13 DIAGNOSIS — R339 Retention of urine, unspecified: Secondary | ICD-10-CM | POA: Diagnosis not present

## 2020-04-13 DIAGNOSIS — G629 Polyneuropathy, unspecified: Secondary | ICD-10-CM | POA: Diagnosis not present

## 2020-04-14 DIAGNOSIS — G9511 Acute infarction of spinal cord (embolic) (nonembolic): Secondary | ICD-10-CM | POA: Diagnosis not present

## 2020-04-14 DIAGNOSIS — R2689 Other abnormalities of gait and mobility: Secondary | ICD-10-CM | POA: Diagnosis not present

## 2020-04-14 DIAGNOSIS — G9581 Conus medullaris syndrome: Secondary | ICD-10-CM | POA: Diagnosis not present

## 2020-04-15 DIAGNOSIS — I10 Essential (primary) hypertension: Secondary | ICD-10-CM | POA: Diagnosis not present

## 2020-04-15 DIAGNOSIS — Z1159 Encounter for screening for other viral diseases: Secondary | ICD-10-CM | POA: Diagnosis not present

## 2020-04-15 DIAGNOSIS — M5412 Radiculopathy, cervical region: Secondary | ICD-10-CM | POA: Diagnosis not present

## 2020-04-15 DIAGNOSIS — M48062 Spinal stenosis, lumbar region with neurogenic claudication: Secondary | ICD-10-CM | POA: Diagnosis not present

## 2020-04-15 DIAGNOSIS — G959 Disease of spinal cord, unspecified: Secondary | ICD-10-CM | POA: Diagnosis not present

## 2020-04-15 DIAGNOSIS — R9082 White matter disease, unspecified: Secondary | ICD-10-CM | POA: Diagnosis not present

## 2020-04-15 DIAGNOSIS — E785 Hyperlipidemia, unspecified: Secondary | ICD-10-CM | POA: Diagnosis not present

## 2020-04-15 DIAGNOSIS — Z01818 Encounter for other preprocedural examination: Secondary | ICD-10-CM | POA: Diagnosis not present

## 2020-04-15 DIAGNOSIS — R29898 Other symptoms and signs involving the musculoskeletal system: Secondary | ICD-10-CM | POA: Diagnosis not present

## 2020-04-15 DIAGNOSIS — Z79899 Other long term (current) drug therapy: Secondary | ICD-10-CM | POA: Diagnosis not present

## 2020-04-19 DIAGNOSIS — R339 Retention of urine, unspecified: Secondary | ICD-10-CM | POA: Diagnosis not present

## 2020-04-19 DIAGNOSIS — N319 Neuromuscular dysfunction of bladder, unspecified: Secondary | ICD-10-CM | POA: Diagnosis not present

## 2020-04-19 DIAGNOSIS — G9581 Conus medullaris syndrome: Secondary | ICD-10-CM | POA: Diagnosis not present

## 2020-04-19 DIAGNOSIS — G9511 Acute infarction of spinal cord (embolic) (nonembolic): Secondary | ICD-10-CM | POA: Diagnosis not present

## 2020-04-19 DIAGNOSIS — R2689 Other abnormalities of gait and mobility: Secondary | ICD-10-CM | POA: Diagnosis not present

## 2020-04-20 ENCOUNTER — Telehealth: Payer: Self-pay | Admitting: Neurology

## 2020-04-20 NOTE — Telephone Encounter (Signed)
Scanned the lab report soluble IL 2 from [facility],  Unbound portion: slL2R in a plasma sample, level was within normal limit 512 ([phone] U/ml)

## 2020-04-22 DIAGNOSIS — R2689 Other abnormalities of gait and mobility: Secondary | ICD-10-CM | POA: Diagnosis not present

## 2020-04-22 DIAGNOSIS — G9581 Conus medullaris syndrome: Secondary | ICD-10-CM | POA: Diagnosis not present

## 2020-04-22 DIAGNOSIS — G9511 Acute infarction of spinal cord (embolic) (nonembolic): Secondary | ICD-10-CM | POA: Diagnosis not present

## 2020-04-26 DIAGNOSIS — G9581 Conus medullaris syndrome: Secondary | ICD-10-CM | POA: Diagnosis not present

## 2020-04-26 DIAGNOSIS — G9511 Acute infarction of spinal cord (embolic) (nonembolic): Secondary | ICD-10-CM | POA: Diagnosis not present

## 2020-04-26 DIAGNOSIS — R2689 Other abnormalities of gait and mobility: Secondary | ICD-10-CM | POA: Diagnosis not present

## 2020-05-02 DIAGNOSIS — R2689 Other abnormalities of gait and mobility: Secondary | ICD-10-CM | POA: Diagnosis not present

## 2020-05-02 DIAGNOSIS — G9581 Conus medullaris syndrome: Secondary | ICD-10-CM | POA: Diagnosis not present

## 2020-05-02 DIAGNOSIS — G9511 Acute infarction of spinal cord (embolic) (nonembolic): Secondary | ICD-10-CM | POA: Diagnosis not present

## 2020-05-03 ENCOUNTER — Other Ambulatory Visit: Payer: Self-pay

## 2020-05-06 DIAGNOSIS — G9511 Acute infarction of spinal cord (embolic) (nonembolic): Secondary | ICD-10-CM | POA: Diagnosis not present

## 2020-05-06 DIAGNOSIS — R2689 Other abnormalities of gait and mobility: Secondary | ICD-10-CM | POA: Diagnosis not present

## 2020-05-06 DIAGNOSIS — G9581 Conus medullaris syndrome: Secondary | ICD-10-CM | POA: Diagnosis not present

## 2020-05-12 DIAGNOSIS — G9511 Acute infarction of spinal cord (embolic) (nonembolic): Secondary | ICD-10-CM | POA: Diagnosis not present

## 2020-05-12 DIAGNOSIS — R2689 Other abnormalities of gait and mobility: Secondary | ICD-10-CM | POA: Diagnosis not present

## 2020-05-12 DIAGNOSIS — G9581 Conus medullaris syndrome: Secondary | ICD-10-CM | POA: Diagnosis not present

## 2020-05-16 DIAGNOSIS — M48062 Spinal stenosis, lumbar region with neurogenic claudication: Secondary | ICD-10-CM | POA: Diagnosis not present

## 2020-05-16 DIAGNOSIS — M5412 Radiculopathy, cervical region: Secondary | ICD-10-CM | POA: Diagnosis not present

## 2020-05-17 DIAGNOSIS — G9581 Conus medullaris syndrome: Secondary | ICD-10-CM | POA: Diagnosis not present

## 2020-05-17 DIAGNOSIS — R2689 Other abnormalities of gait and mobility: Secondary | ICD-10-CM | POA: Diagnosis not present

## 2020-05-17 DIAGNOSIS — G9511 Acute infarction of spinal cord (embolic) (nonembolic): Secondary | ICD-10-CM | POA: Diagnosis not present

## 2020-05-19 DIAGNOSIS — G9511 Acute infarction of spinal cord (embolic) (nonembolic): Secondary | ICD-10-CM | POA: Diagnosis not present

## 2020-05-19 DIAGNOSIS — G9581 Conus medullaris syndrome: Secondary | ICD-10-CM | POA: Diagnosis not present

## 2020-05-19 DIAGNOSIS — R2689 Other abnormalities of gait and mobility: Secondary | ICD-10-CM | POA: Diagnosis not present

## 2020-05-20 DIAGNOSIS — M4716 Other spondylosis with myelopathy, lumbar region: Secondary | ICD-10-CM | POA: Diagnosis not present

## 2020-05-20 DIAGNOSIS — M5136 Other intervertebral disc degeneration, lumbar region: Secondary | ICD-10-CM | POA: Diagnosis not present

## 2020-05-20 DIAGNOSIS — G9589 Other specified diseases of spinal cord: Secondary | ICD-10-CM | POA: Diagnosis not present

## 2020-05-20 DIAGNOSIS — G9511 Acute infarction of spinal cord (embolic) (nonembolic): Secondary | ICD-10-CM | POA: Diagnosis not present

## 2020-05-20 DIAGNOSIS — M5127 Other intervertebral disc displacement, lumbosacral region: Secondary | ICD-10-CM | POA: Diagnosis not present

## 2020-05-20 DIAGNOSIS — M5126 Other intervertebral disc displacement, lumbar region: Secondary | ICD-10-CM | POA: Diagnosis not present

## 2020-05-23 DIAGNOSIS — G9511 Acute infarction of spinal cord (embolic) (nonembolic): Secondary | ICD-10-CM | POA: Diagnosis not present

## 2020-05-23 DIAGNOSIS — R2689 Other abnormalities of gait and mobility: Secondary | ICD-10-CM | POA: Diagnosis not present

## 2020-05-23 DIAGNOSIS — G9581 Conus medullaris syndrome: Secondary | ICD-10-CM | POA: Diagnosis not present

## 2020-05-25 DIAGNOSIS — G9581 Conus medullaris syndrome: Secondary | ICD-10-CM | POA: Diagnosis not present

## 2020-05-25 DIAGNOSIS — R2689 Other abnormalities of gait and mobility: Secondary | ICD-10-CM | POA: Diagnosis not present

## 2020-05-25 DIAGNOSIS — G9511 Acute infarction of spinal cord (embolic) (nonembolic): Secondary | ICD-10-CM | POA: Diagnosis not present

## 2020-05-31 DIAGNOSIS — R2689 Other abnormalities of gait and mobility: Secondary | ICD-10-CM | POA: Diagnosis not present

## 2020-05-31 DIAGNOSIS — G9511 Acute infarction of spinal cord (embolic) (nonembolic): Secondary | ICD-10-CM | POA: Diagnosis not present

## 2020-05-31 DIAGNOSIS — G9581 Conus medullaris syndrome: Secondary | ICD-10-CM | POA: Diagnosis not present

## 2020-06-02 ENCOUNTER — Encounter: Payer: Self-pay | Admitting: *Deleted

## 2020-06-02 DIAGNOSIS — G9581 Conus medullaris syndrome: Secondary | ICD-10-CM | POA: Diagnosis not present

## 2020-06-02 DIAGNOSIS — G9511 Acute infarction of spinal cord (embolic) (nonembolic): Secondary | ICD-10-CM | POA: Diagnosis not present

## 2020-06-02 DIAGNOSIS — R2689 Other abnormalities of gait and mobility: Secondary | ICD-10-CM | POA: Diagnosis not present

## 2020-06-06 DIAGNOSIS — R339 Retention of urine, unspecified: Secondary | ICD-10-CM | POA: Diagnosis not present

## 2020-06-06 DIAGNOSIS — N319 Neuromuscular dysfunction of bladder, unspecified: Secondary | ICD-10-CM | POA: Diagnosis not present

## 2020-06-07 DIAGNOSIS — M48061 Spinal stenosis, lumbar region without neurogenic claudication: Secondary | ICD-10-CM | POA: Diagnosis not present

## 2020-06-07 DIAGNOSIS — R531 Weakness: Secondary | ICD-10-CM | POA: Diagnosis not present

## 2020-06-08 ENCOUNTER — Encounter: Payer: Self-pay | Admitting: *Deleted

## 2020-06-08 DIAGNOSIS — G9581 Conus medullaris syndrome: Secondary | ICD-10-CM | POA: Diagnosis not present

## 2020-06-08 DIAGNOSIS — R2689 Other abnormalities of gait and mobility: Secondary | ICD-10-CM | POA: Diagnosis not present

## 2020-06-08 DIAGNOSIS — G9511 Acute infarction of spinal cord (embolic) (nonembolic): Secondary | ICD-10-CM | POA: Diagnosis not present

## 2020-06-14 DIAGNOSIS — R2689 Other abnormalities of gait and mobility: Secondary | ICD-10-CM | POA: Diagnosis not present

## 2020-06-14 DIAGNOSIS — G9511 Acute infarction of spinal cord (embolic) (nonembolic): Secondary | ICD-10-CM | POA: Diagnosis not present

## 2020-06-14 DIAGNOSIS — G9581 Conus medullaris syndrome: Secondary | ICD-10-CM | POA: Diagnosis not present

## 2020-06-15 ENCOUNTER — Telehealth: Payer: Self-pay | Admitting: *Deleted

## 2020-06-15 NOTE — Telephone Encounter (Signed)
She has a pending appt on 07/14/20 at our office. She was recently seen at [facility] and the MD wanted her to ask Dr. Krista Blue about her dosage of aspirin. Currently, she is taking 325mg , one tab daily. The patient states is was recommended for her to reduce it to 81mg , one tablet daily. She would like for Dr. Krista Blue to make this decision.

## 2020-06-15 NOTE — Addendum Note (Signed)
Addended by: Noberto Retort C on: 06/15/2020 12:11 PM   Modules accepted: Orders

## 2020-06-15 NOTE — Telephone Encounter (Signed)
It is ok to follow advise from [facility]

## 2020-06-15 NOTE — Telephone Encounter (Signed)
I called and left this information on her personal voicemail (ok per DPR). Provided our number to call back with any questions.

## 2020-06-16 DIAGNOSIS — R2689 Other abnormalities of gait and mobility: Secondary | ICD-10-CM | POA: Diagnosis not present

## 2020-06-16 DIAGNOSIS — G9511 Acute infarction of spinal cord (embolic) (nonembolic): Secondary | ICD-10-CM | POA: Diagnosis not present

## 2020-06-16 DIAGNOSIS — M48062 Spinal stenosis, lumbar region with neurogenic claudication: Secondary | ICD-10-CM | POA: Diagnosis not present

## 2020-06-16 DIAGNOSIS — M5412 Radiculopathy, cervical region: Secondary | ICD-10-CM | POA: Diagnosis not present

## 2020-06-16 DIAGNOSIS — G9581 Conus medullaris syndrome: Secondary | ICD-10-CM | POA: Diagnosis not present

## 2020-06-20 DIAGNOSIS — G9581 Conus medullaris syndrome: Secondary | ICD-10-CM | POA: Diagnosis not present

## 2020-06-20 DIAGNOSIS — G9511 Acute infarction of spinal cord (embolic) (nonembolic): Secondary | ICD-10-CM | POA: Diagnosis not present

## 2020-06-20 DIAGNOSIS — R2689 Other abnormalities of gait and mobility: Secondary | ICD-10-CM | POA: Diagnosis not present

## 2020-06-22 ENCOUNTER — Other Ambulatory Visit (HOSPITAL_COMMUNITY): Payer: Self-pay | Admitting: Psychiatry

## 2020-06-22 ENCOUNTER — Other Ambulatory Visit: Payer: Self-pay | Admitting: Psychiatry

## 2020-06-22 DIAGNOSIS — G9511 Acute infarction of spinal cord (embolic) (nonembolic): Secondary | ICD-10-CM | POA: Diagnosis not present

## 2020-06-22 DIAGNOSIS — R2689 Other abnormalities of gait and mobility: Secondary | ICD-10-CM | POA: Diagnosis not present

## 2020-06-22 DIAGNOSIS — G9581 Conus medullaris syndrome: Secondary | ICD-10-CM | POA: Diagnosis not present

## 2020-06-24 DIAGNOSIS — M5412 Radiculopathy, cervical region: Secondary | ICD-10-CM | POA: Diagnosis not present

## 2020-06-24 DIAGNOSIS — M48062 Spinal stenosis, lumbar region with neurogenic claudication: Secondary | ICD-10-CM | POA: Diagnosis not present

## 2020-06-24 DIAGNOSIS — M47812 Spondylosis without myelopathy or radiculopathy, cervical region: Secondary | ICD-10-CM | POA: Diagnosis not present

## 2020-06-27 DIAGNOSIS — G9581 Conus medullaris syndrome: Secondary | ICD-10-CM | POA: Diagnosis not present

## 2020-06-27 DIAGNOSIS — G9511 Acute infarction of spinal cord (embolic) (nonembolic): Secondary | ICD-10-CM | POA: Diagnosis not present

## 2020-06-27 DIAGNOSIS — R2689 Other abnormalities of gait and mobility: Secondary | ICD-10-CM | POA: Diagnosis not present

## 2020-07-01 DIAGNOSIS — G9581 Conus medullaris syndrome: Secondary | ICD-10-CM | POA: Diagnosis not present

## 2020-07-01 DIAGNOSIS — G9511 Acute infarction of spinal cord (embolic) (nonembolic): Secondary | ICD-10-CM | POA: Diagnosis not present

## 2020-07-01 DIAGNOSIS — R2689 Other abnormalities of gait and mobility: Secondary | ICD-10-CM | POA: Diagnosis not present

## 2020-07-04 DIAGNOSIS — R2689 Other abnormalities of gait and mobility: Secondary | ICD-10-CM | POA: Diagnosis not present

## 2020-07-04 DIAGNOSIS — G9511 Acute infarction of spinal cord (embolic) (nonembolic): Secondary | ICD-10-CM | POA: Diagnosis not present

## 2020-07-04 DIAGNOSIS — G9581 Conus medullaris syndrome: Secondary | ICD-10-CM | POA: Diagnosis not present

## 2020-07-05 DIAGNOSIS — M48062 Spinal stenosis, lumbar region with neurogenic claudication: Secondary | ICD-10-CM | POA: Diagnosis not present

## 2020-07-05 DIAGNOSIS — M5412 Radiculopathy, cervical region: Secondary | ICD-10-CM | POA: Diagnosis not present

## 2020-07-06 DIAGNOSIS — G9581 Conus medullaris syndrome: Secondary | ICD-10-CM | POA: Diagnosis not present

## 2020-07-06 DIAGNOSIS — G9511 Acute infarction of spinal cord (embolic) (nonembolic): Secondary | ICD-10-CM | POA: Diagnosis not present

## 2020-07-06 DIAGNOSIS — R2689 Other abnormalities of gait and mobility: Secondary | ICD-10-CM | POA: Diagnosis not present

## 2020-07-11 DIAGNOSIS — G9511 Acute infarction of spinal cord (embolic) (nonembolic): Secondary | ICD-10-CM | POA: Diagnosis not present

## 2020-07-11 DIAGNOSIS — R2689 Other abnormalities of gait and mobility: Secondary | ICD-10-CM | POA: Diagnosis not present

## 2020-07-11 DIAGNOSIS — G9581 Conus medullaris syndrome: Secondary | ICD-10-CM | POA: Diagnosis not present

## 2020-07-14 ENCOUNTER — Ambulatory Visit (INDEPENDENT_AMBULATORY_CARE_PROVIDER_SITE_OTHER): Payer: PPO | Admitting: Neurology

## 2020-07-14 ENCOUNTER — Encounter: Payer: Self-pay | Admitting: Neurology

## 2020-07-14 ENCOUNTER — Other Ambulatory Visit: Payer: Self-pay

## 2020-07-14 VITALS — BP 132/78 | HR 77 | Ht 60.5 in | Wt 136.5 lb

## 2020-07-14 DIAGNOSIS — R269 Unspecified abnormalities of gait and mobility: Secondary | ICD-10-CM

## 2020-07-14 DIAGNOSIS — Z1231 Encounter for screening mammogram for malignant neoplasm of breast: Secondary | ICD-10-CM | POA: Diagnosis not present

## 2020-07-14 DIAGNOSIS — Z79899 Other long term (current) drug therapy: Secondary | ICD-10-CM | POA: Diagnosis not present

## 2020-07-14 DIAGNOSIS — I10 Essential (primary) hypertension: Secondary | ICD-10-CM | POA: Diagnosis not present

## 2020-07-14 DIAGNOSIS — Z1331 Encounter for screening for depression: Secondary | ICD-10-CM | POA: Diagnosis not present

## 2020-07-14 DIAGNOSIS — Z6825 Body mass index (BMI) 25.0-25.9, adult: Secondary | ICD-10-CM | POA: Diagnosis not present

## 2020-07-14 DIAGNOSIS — M792 Neuralgia and neuritis, unspecified: Secondary | ICD-10-CM | POA: Diagnosis not present

## 2020-07-14 DIAGNOSIS — G9581 Conus medullaris syndrome: Secondary | ICD-10-CM | POA: Diagnosis not present

## 2020-07-14 DIAGNOSIS — R2242 Localized swelling, mass and lump, left lower limb: Secondary | ICD-10-CM | POA: Diagnosis not present

## 2020-07-14 DIAGNOSIS — G629 Polyneuropathy, unspecified: Secondary | ICD-10-CM | POA: Diagnosis not present

## 2020-07-14 DIAGNOSIS — E785 Hyperlipidemia, unspecified: Secondary | ICD-10-CM | POA: Diagnosis not present

## 2020-07-14 MED ORDER — OXCARBAZEPINE 150 MG PO TABS
150.0000 mg | ORAL_TABLET | Freq: Two times a day (BID) | ORAL | 11 refills | Status: DC
Start: 2020-07-14 — End: 2020-08-01

## 2020-07-14 NOTE — Progress Notes (Signed)
PATIENT: Beverly Wolfe DOB: 27-Sep-1951  Chief Complaint  Patient presents with  . Gait Problem/Neuropathic Pain    She is here with her daughter, Francoise Schaumann. They would like to discuss her evaluation at [facility].      HISTORICAL  Beverly Wolfe is a 69 year old female, seen in request by her primary care physician Dr. Ernestene Kiel, for evaluation of gait abnormality, incontinence, initial evaluation was on February 02, 2020.  She is accompanied by her daughter [name] at visit.  I have reviewed and summarized the referring note from the referring physician.  She had past medical history of hypertension, hyperlipidemia, cervical decompression surgery twice, initial surgery was done at [facility] by orthopedic surgeon Dr. Ellender Hose in 2010, she presented with neck pain, radiating pain to left upper extremity, had a posterior fusion of C3-7, complains of occasionally flareup of neck pain, radiating pain to left upper extremity, arm weakness.  Has been followed up by orthopedic surgeon regularly, had  second cervical decompression of C7-T1 surgery by [facility] orthopedic surgeon Dr. Donivan Scull in November 2020 for progressive right hand weakness, numbness, recurrent neck pain radiating to right upper extremity, post surgery has helped her symptoms, at baseline, she is highly function, ambulate without any difficulty.  She does has residual right hand muscle atrophy, and weakness.  On December 10, 2019, she was able to go shopping at her baseline, unload her car, at 6 PM, she had sudden onset severe left lower extremity pain, starting at left lateral leg, spreading rostrally to involving left lateral thigh and, in the left foot, severe burning sensation lasting for 30 minutes, she was able to walk to her mailbox, 1 hour later, by 7 PM, she had a sudden onset of right lower extremity involvement, in a similar pattern, but seems to be a milder degree, starting from right lateral calf, extending to right  thigh, and right foot, she took her hydrocodone rested for a while, while taking a shower, she noticed difficulty bearing weight, drying her hair, by 8 PM, she had such severe bilateral lower extremity pain, she called her son to bring her to local emergency room, it is difficult for her to sit still due to extreme bilateral lower extremity pain from bilateral hip done, when she is trying to get up changing position, she noticed bilateral lower extremity weakness, difficulty bearing weight  At night of March for 2021 at emergency room of [facility], she got CT lumbar spine, I personally reviewed the film, Multilevel lumbar degenerative changes, there is multifactorial spinal stenosis, most severe at L4-5 followed by L3-4 then L2-3. At L5-S1, there is narrowing of the subarticular lateral recesses and foramina right more than left. Certainly, the findings are severe enough to be symptomatic.  During her emergency room stay in, she develop urinary retention, went to bathroom multiple times without emptying her bladder required cath, she continue complains severe bilateral lower extremity pain, difficulty walking, bilateral lower extremity weakness.  She had MRI on December 11, 2019, I personally reviewed the film, covered T11 through upper sacral region, coronal section cover T12 to lumbar region, multilevel degenerative changes, moderately severe spinal stenosis at L4-5, right lateral recess compression could affect right L5 nerve roots, mild spinal stenosis at L3-4, moderate bilateral facet arthritis at L5-S1.   She underwent posterior decompression with laminectomy for decompression of cauda equina and nerve root at L4-5, L3-4 by Dr. Donivan Scull on December 16, 2019.  From reviewing Dr. Towanda Octave presurgical evaluation, patient was  noted to have weakness involving bilateral foot dorsiflexion, and plantarflexion.  Surgery did help her low back pain, which is under reasonable control now, she went to rehab,  but still has persistent bilateral leg weakness, especially distal leg, gait abnormality.  She had a urinary retention at emergency room, now she has urinary incontinence, barely any urge then urinary incontinence without warning signs, when she defecate, she could not feel it come out, she has paresthesia at her saddle area.  UPDATE Mar 02 2020: She is accompanied by her son at today's clinical visit, which showed evidence of chronic bilateral lumbosacral radiculopathy, left worse than right, in addition, there is evidence of upper motor neuron damage, she has decreased activation at the left tibialis posterior, medial gastrocnemius muscle on the left leg, she also has evidence of chronic right cervical radiculopathy.  She complains of significant neuropathic pain despite taking gabapentin 300 mg twice daily, higher dose, she complains of depression, her pain involving bilateral lower extremity, saddle region, previously responded better to Lyrica, tried Cymbalta could not tolerate the side effect of confusion, GI side effect  We also personally reviewed MRI of lumbar spine in March 2021, compared with her repeat MRI lumbar spine in April 2021,  MRI of lumbar spine on December 11, 2019,  covered T11 through upper sacral region, coronal section cover T12 to lumbar region, multilevel degenerative changes, moderately severe spinal stenosis at L4-5, right lateral recess compression could affect right L5 nerve roots, mild spinal stenosis at L3-4, moderate bilateral facet arthritis at L5-S1.  There is also evidence of T2 hyper density lesion on the sagittal, and coronal section, extending from lower T11 to conus medullaris, no contrast enhancement, evidence of lower thoracic swelling, which has improved on repeat MRI lumbar on February 02, 2020  MRI thoracic spine showed no significant canal stenosis, lower thoracic T12 spine signal changes,  MRI of cervical spine showed chronic C3-C7 fusion, no significant  canal stenosis.  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 Prince Rome,  UPDATE Jul 14 2020: She reported 70% improvement, she can walk but needs cane, still have numbness at bilateral lower extremity, and the saddle area, on polypharmacy treatment, hydrocodone, Lyrica 200 mg 3 times a day, complains of 4 out of 10 constant burning pain, is tearful during today's visit  I also reviewed [facility] evaluation, laboratory in July 2021, TB was negative, csf Ig G index 0.7,Hepatitis B was negative, HTLV 1 and 2 was negative, CSF showed elevated nucleated cell 44, RBC 63,000, tube 4 03/5874,  [facility] neurology department evaluation of March 10, 2020, atypical for spinal stroke based on radiographic distribution, however she  does have vascular risk factor of hypertension, atherosclerosis, and the posterior surgical embolic process, the spinal cord lesion may also represent immune mediated myelitis, which sometimes may be associated with TB hepatitis, less likely demyelinating although cannot  excluded, will also to be evaluated for sarcoidosis, Shogry myelitis, paraneoplastic,  Work-up so far has included negative ANA, ESR C-reactive protein, HIV, RPR, Lyme, ACE, Normal or negative serum glucose, ENA, ANCA, rheumatoid factor, antiphospholipid panel, HTLV-1, 2 antibody, MO G IgG, MMR IgG, TB quanterferon, hepatitis B hepatitis C antibody,  Could not rule out stroke, continue aspirin 81 mg daily  Patient was seen by third-year neurologist in training Dr. Darrol Angel, with attending physician Dr.Dujmovic-Basuroski  MRA of spinal cord is pending  We also personally reviewed MRI of lumbar spine in August 2021, Sequela of remote conus insult. Faint enhancement of the left S1 nerve root is unchanged and likely reflects postsurgical/inflammatory sequela.  Sequela of posterior lumbar decompression. No significant spinal canal narrowing.  Mild to moderate right L4-5 neural foraminal narrowing, unchanged.  Mild left L1-2, bilateral L2-4, left L4-5 and right L5-S1 neural foraminal narrowing, unchanged.  Conjoined right L5-S1 nerve root, normal anatomic variant.  She no longer has bladder incontinence, alternating between constipation and diarrhea, could not feel the movement  REVIEW OF SYSTEMS: Full 14 system review of systems performed and notable only for as above All other review of systems were negative.  ALLERGIES: Allergies  Allergen Reactions  . Cymbalta [Duloxetine Hcl] Nausea Only and Other (See Comments)    Shakiness  . Other     Sodium lauryl sulfate - causes mouth sores   . Percocet [Oxycodone-Acetaminophen] Itching  . Talwin [Pentazocine] Nausea And Vomiting and Other (See Comments)    Hallucinations  . Bactrim [Sulfamethoxazole-Trimethoprim] Itching and Rash  . Sulfa Antibiotics Itching and Rash    HOME MEDICATIONS: Current Outpatient Medications  Medication Sig Dispense Refill  . aspirin EC 81 MG tablet Take 81 mg by mouth daily. Swallow whole.    Marland Kitchen  CALCIUM PO Take 1,000 mg by mouth daily.    . Cholecalciferol (VITAMIN D3) 1000 units CAPS Take by mouth daily.    . Coenzyme Q10 (COQ10 PO) Take 100 mg by mouth daily.    Marland Kitchen HYDROCHLOROTHIAZIDE PO Take 12.5 mg by mouth daily.     Marland Kitchen HYDROcodone-acetaminophen (NORCO/VICODIN) 5-325 MG tablet Take 1 tablet by mouth every 4 (four) hours as needed.    Marland Kitchen Lifitegrast (XIIDRA) 5 % SOLN Apply 1 drop to eye daily. Both eyes    . omega-3 acid ethyl esters (LOVAZA) 1 g capsule Take 2 g by mouth 2 (two) times daily.     . polyethylene glycol (MIRALAX / GLYCOLAX) 17 g packet Take 17 g by mouth daily.    . pregabalin (LYRICA) 100 MG capsule Take 1 capsule (100 mg total) by mouth 3 (three) times daily. 90 capsule 5  . rosuvastatin (CRESTOR) 10 MG tablet Take 1 tablet (10 mg total) by mouth daily. 90 tablet 4  . SENNA PO Take 3.6 mg by mouth at bedtime.    . tamsulosin (FLOMAX) 0.4 MG CAPS capsule Take 0.4 mg by mouth daily.    . traMADol (ULTRAM) 50 MG tablet Take 50 mg by mouth every 6 (six) hours as needed.     No current facility-administered medications for this visit.    PAST MEDICAL HISTORY: Past Medical History:  Diagnosis Date  . Constipation   . High blood pressure   . Hypercholesteremia   .  Numbness   . Spinal stenosis   . Urinary incontinence     PAST SURGICAL HISTORY: Past Surgical History:  Procedure Laterality Date  . APPENDECTOMY  03/11/1979  . BREAST BIOPSY Left 08/13/1989  . BREAST BIOPSY Left 10/05/1998  . BUNIONECTOMY  02/04/1986  . CERVICAL FUSION  2010, 2020  . GYNECOLOGIC CRYOSURGERY    . OTHER SURGICAL HISTORY  12/14/2014   Right arm radial nerve release  . POSTERIOR LAMINECTOMY / DECOMPRESSION LUMBAR SPINE     L3-5  . right radial nerve release    . TOE SURGERY Right 01/24/2006  . TUBAL LIGATION  03/11/1979  . ULNAR NERVE TRANSPOSITION Left 08/31/1987  . UMBILICAL HERNIA REPAIR  01/20/2013    FAMILY HISTORY: Family History  Problem Relation Age of Onset  .  Allergic rhinitis Mother   . Congestive Heart Failure Mother   . Emphysema Mother   . Hypertension Mother   . Hypercholesterolemia Mother   . Anxiety disorder Mother   . Cancer - Other Father        Oral Cancer  . Stroke Father   . Benign prostatic hyperplasia Father   . COPD Sister   . Diabetes Maternal Aunt   . Diabetes Maternal Grandmother     SOCIAL HISTORY: Social History   Socioeconomic History  . Marital status: Widowed    Spouse name: Not on file  . Number of children: 2  . Years of education: 14 years  . Highest education level: Not on file  Occupational History  . Occupation: Retired  Tobacco Use  . Smoking status: Never Smoker  . Smokeless tobacco: Never Used  Substance and Sexual Activity  . Alcohol use: Not Currently  . Drug use: Never  . Sexual activity: Not on file  Other Topics Concern  . Not on file  Social History Narrative   Lives at home alone.   Right-handed.   One cup caffeine per day.   Social Determinants of Health   Financial Resource Strain:   . Difficulty of Paying Living Expenses: Not on file  Food Insecurity:   . Worried About Charity fundraiser in the Last Year: Not on file  . Ran Out of Food in the Last Year: Not on file  Transportation Needs:   . Lack of Transportation (Medical): Not on file  . Lack of Transportation (Non-Medical): Not on file  Physical Activity:   . Days of Exercise per Week: Not on file  . Minutes of Exercise per Session: Not on file  Stress:   . Feeling of Stress : Not on file  Social Connections:   . Frequency of Communication with Friends and Family: Not on file  . Frequency of Social Gatherings with Friends and Family: Not on file  . Attends Religious Services: Not on file  . Active Member of Clubs or Organizations: Not on file  . Attends Archivist Meetings: Not on file  . Marital Status: Not on file  Intimate Partner Violence:   . Fear of Current or Ex-Partner: Not on file  .  Emotionally Abused: Not on file  . Physically Abused: Not on file  . Sexually Abused: Not on file     PHYSICAL EXAM   Vitals:   07/14/20 1306  Height: 5' 0.5" (1.537 m)   Not recorded     Body mass index is 25.55 kg/m.  PHYSICAL EXAMNIATION:  Gen: NAD, conversant, well nourised, well groomed  Cardiovascular: Regular rate rhythm, no peripheral edema, warm, nontender. Eyes: Conjunctivae clear without exudates or hemorrhage Neck: Supple, no carotid bruits. Pulmonary: Clear to auscultation bilaterally   NEUROLOGICAL EXAM:  MENTAL STATUS: Speech/cognition: Awake, alert, oriented to history taking and casual conversation  CRANIAL NERVES: CN II: Visual fields are full to confrontation. Pupils are round equal and briskly reactive to light. CN III, IV, VI: extraocular movement are normal. No ptosis. CN V: Facial sensation is intact to light touch CN VII: Face is symmetric with normal eye closure  CN VIII: Hearing is normal to causal conversation. CN IX, X: Phonation is normal. CN XI: Head turning and shoulder shrug are intact  MOTOR: She has right forearm and right intrinsic hand muscle atrophy, had a history of right radial decompression surgery with diagnosis of supinator compression, proximal upper extremity muscle strength is normal, R/L pronation 4/4-, supranation 4/5-, wrist extension 4+/5, wrist flexion4/5, grip4/5, finger abduction4/4+  Lower extremity normal muscle tone, muscle strength (R/L), hip flexion 5/5, hip adduction 5/5, hip abduction 5/5 -, knee extension 5/5, knee flexion 5/5, ankle dorsiflexion 5/5-, ankle plantarflexion 5/5, ankle inversion 5/5, eversion 5/5  REFLEXES: Reflexes are 2+ and symmetric at the biceps, triceps, 3/3 knees, and absent at ankles. Plantar responses are flexor on right, mute on left  SENSORY: Length dependent decreased to light touch, vibratory sensation, pinprick to mid shin level  COORDINATION: There is no  trunk or limb dysmetria noted.  GAIT/STANCE: She needs push-up to get up from seated position, wide-based unsteady, dragging her left leg more  DIAGNOSTIC DATA (LABS, IMAGING, TESTING) - I reviewed patient records, labs, notes, testing and imaging myself where available.   ASSESSMENT AND PLAN  BEATRIZ QUINTELA is a 69 y.o. female    History of cervical radiculopathy status post cervical decompression surgery in the past, Presented with sudden onset left lower extremity followed by right lower extremity extreme pain and weakness, sensory loss, urinary retention, bowel incontinence,  Status post lumbar L4-5, L3-4 decompression surgery on December 14, 2019.  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  Continue aspirin 81 mg daily  Neuropathic pain  Under suboptimal control with gabapentin  Higher dose of Lyrica 200 mg 3 times a day  Norco 5/325 mg every 4 hours for pain management  Pattern Trileptal 150 mg twice a day   Marcial Pacas, M.D. Ph.D.  [facility] [address], [place], [place] [address] Ph: [phone] Fax: [phone]  CC: Ernestene Kiel, MD

## 2020-07-15 DIAGNOSIS — G9511 Acute infarction of spinal cord (embolic) (nonembolic): Secondary | ICD-10-CM | POA: Diagnosis not present

## 2020-07-15 DIAGNOSIS — R2689 Other abnormalities of gait and mobility: Secondary | ICD-10-CM | POA: Diagnosis not present

## 2020-07-15 DIAGNOSIS — G9581 Conus medullaris syndrome: Secondary | ICD-10-CM | POA: Diagnosis not present

## 2020-07-18 DIAGNOSIS — G9511 Acute infarction of spinal cord (embolic) (nonembolic): Secondary | ICD-10-CM | POA: Diagnosis not present

## 2020-07-18 DIAGNOSIS — R2689 Other abnormalities of gait and mobility: Secondary | ICD-10-CM | POA: Diagnosis not present

## 2020-07-18 DIAGNOSIS — G9581 Conus medullaris syndrome: Secondary | ICD-10-CM | POA: Diagnosis not present

## 2020-07-20 DIAGNOSIS — R2689 Other abnormalities of gait and mobility: Secondary | ICD-10-CM | POA: Diagnosis not present

## 2020-07-20 DIAGNOSIS — R2242 Localized swelling, mass and lump, left lower limb: Secondary | ICD-10-CM | POA: Diagnosis not present

## 2020-07-20 DIAGNOSIS — G9581 Conus medullaris syndrome: Secondary | ICD-10-CM | POA: Diagnosis not present

## 2020-07-20 DIAGNOSIS — G9511 Acute infarction of spinal cord (embolic) (nonembolic): Secondary | ICD-10-CM | POA: Diagnosis not present

## 2020-07-22 DIAGNOSIS — R29898 Other symptoms and signs involving the musculoskeletal system: Secondary | ICD-10-CM | POA: Diagnosis not present

## 2020-07-22 DIAGNOSIS — R531 Weakness: Secondary | ICD-10-CM | POA: Diagnosis not present

## 2020-07-25 DIAGNOSIS — G9511 Acute infarction of spinal cord (embolic) (nonembolic): Secondary | ICD-10-CM | POA: Diagnosis not present

## 2020-07-25 DIAGNOSIS — R2689 Other abnormalities of gait and mobility: Secondary | ICD-10-CM | POA: Diagnosis not present

## 2020-07-25 DIAGNOSIS — G9581 Conus medullaris syndrome: Secondary | ICD-10-CM | POA: Diagnosis not present

## 2020-07-28 DIAGNOSIS — G9581 Conus medullaris syndrome: Secondary | ICD-10-CM | POA: Diagnosis not present

## 2020-07-28 DIAGNOSIS — R2689 Other abnormalities of gait and mobility: Secondary | ICD-10-CM | POA: Diagnosis not present

## 2020-07-28 DIAGNOSIS — G9511 Acute infarction of spinal cord (embolic) (nonembolic): Secondary | ICD-10-CM | POA: Diagnosis not present

## 2020-08-01 ENCOUNTER — Telehealth: Payer: Self-pay | Admitting: *Deleted

## 2020-08-01 DIAGNOSIS — R2689 Other abnormalities of gait and mobility: Secondary | ICD-10-CM | POA: Diagnosis not present

## 2020-08-01 DIAGNOSIS — G9511 Acute infarction of spinal cord (embolic) (nonembolic): Secondary | ICD-10-CM | POA: Diagnosis not present

## 2020-08-01 DIAGNOSIS — G9581 Conus medullaris syndrome: Secondary | ICD-10-CM | POA: Diagnosis not present

## 2020-08-01 MED ORDER — IMIPRAMINE HCL 25 MG PO TABS: 25.0000 mg | ORAL_TABLET | Freq: Every day | ORAL | 3 refills | Status: DC

## 2020-08-01 NOTE — Telephone Encounter (Signed)
**  Dr. Felecia Shelling reviewed the patient's chart in Dr. Rhea Belton absence. Per vo by Dr. Felecia Shelling, the patient may stop Trileptal. He will authorize a prescription for Imipramine 25, one tablet at bedtime. I called the patient and she verbalized understanding. She was in agreement with this plan. She will call back with any other concerns.**

## 2020-08-01 NOTE — Telephone Encounter (Signed)
Email from patient:  Dr. Krista Blue,  At my 10/7 visit, you put me on Trileptal which has made me very nauseous. After taking 2 weeks, the symptoms have lessened some, but there is no relief of pain. I can't tell it is doing anything except making me nauseous. I asked my daughter (she is a Software engineer) about antidepressants used for treating nerve pain. She mentioned nortriptyline, amitriptyline, and imipramine. Would any of these help with the nerve pain or can you prescribe something else I could try?  Thanks very much,  Scientist, forensic   **Dr. Felecia Shelling reviewed the patient's chart in Dr. Rhea Belton absence. Per vo by Dr. Felecia Shelling, the patient may stop Trileptal. He will authorize a prescription for Imipramine 25, one tablet at bedtime. I called the patient and she verbalized understanding. She was in agreement with this plan. She will call back with any other concerns.**

## 2020-08-03 DIAGNOSIS — G9511 Acute infarction of spinal cord (embolic) (nonembolic): Secondary | ICD-10-CM | POA: Diagnosis not present

## 2020-08-03 DIAGNOSIS — R2689 Other abnormalities of gait and mobility: Secondary | ICD-10-CM | POA: Diagnosis not present

## 2020-08-03 DIAGNOSIS — R945 Abnormal results of liver function studies: Secondary | ICD-10-CM | POA: Diagnosis not present

## 2020-08-03 DIAGNOSIS — G9581 Conus medullaris syndrome: Secondary | ICD-10-CM | POA: Diagnosis not present

## 2020-08-11 DIAGNOSIS — H25813 Combined forms of age-related cataract, bilateral: Secondary | ICD-10-CM | POA: Diagnosis not present

## 2020-08-11 DIAGNOSIS — H04123 Dry eye syndrome of bilateral lacrimal glands: Secondary | ICD-10-CM | POA: Diagnosis not present

## 2020-08-12 DIAGNOSIS — Z1231 Encounter for screening mammogram for malignant neoplasm of breast: Secondary | ICD-10-CM | POA: Diagnosis not present

## 2020-09-06 DIAGNOSIS — R339 Retention of urine, unspecified: Secondary | ICD-10-CM | POA: Diagnosis not present

## 2020-09-16 DIAGNOSIS — M5412 Radiculopathy, cervical region: Secondary | ICD-10-CM | POA: Diagnosis not present

## 2020-09-16 DIAGNOSIS — Z981 Arthrodesis status: Secondary | ICD-10-CM | POA: Diagnosis not present

## 2020-09-20 DIAGNOSIS — G9511 Acute infarction of spinal cord (embolic) (nonembolic): Secondary | ICD-10-CM | POA: Diagnosis not present

## 2020-09-20 DIAGNOSIS — Z6826 Body mass index (BMI) 26.0-26.9, adult: Secondary | ICD-10-CM | POA: Diagnosis not present

## 2020-10-14 DIAGNOSIS — Z79899 Other long term (current) drug therapy: Secondary | ICD-10-CM | POA: Diagnosis not present

## 2020-10-14 DIAGNOSIS — Z0001 Encounter for general adult medical examination with abnormal findings: Secondary | ICD-10-CM | POA: Diagnosis not present

## 2020-10-14 DIAGNOSIS — G629 Polyneuropathy, unspecified: Secondary | ICD-10-CM | POA: Diagnosis not present

## 2020-10-14 DIAGNOSIS — I10 Essential (primary) hypertension: Secondary | ICD-10-CM | POA: Diagnosis not present

## 2020-10-14 DIAGNOSIS — Z1331 Encounter for screening for depression: Secondary | ICD-10-CM | POA: Diagnosis not present

## 2020-10-14 DIAGNOSIS — Z1339 Encounter for screening examination for other mental health and behavioral disorders: Secondary | ICD-10-CM | POA: Diagnosis not present

## 2020-10-14 DIAGNOSIS — K644 Residual hemorrhoidal skin tags: Secondary | ICD-10-CM | POA: Diagnosis not present

## 2020-10-14 DIAGNOSIS — R2681 Unsteadiness on feet: Secondary | ICD-10-CM | POA: Diagnosis not present

## 2020-10-14 DIAGNOSIS — R682 Dry mouth, unspecified: Secondary | ICD-10-CM | POA: Diagnosis not present

## 2020-10-14 DIAGNOSIS — E785 Hyperlipidemia, unspecified: Secondary | ICD-10-CM | POA: Diagnosis not present

## 2020-10-18 DIAGNOSIS — R339 Retention of urine, unspecified: Secondary | ICD-10-CM | POA: Diagnosis not present

## 2020-10-18 DIAGNOSIS — N319 Neuromuscular dysfunction of bladder, unspecified: Secondary | ICD-10-CM | POA: Diagnosis not present

## 2020-11-21 DIAGNOSIS — R748 Abnormal levels of other serum enzymes: Secondary | ICD-10-CM | POA: Diagnosis not present

## 2020-11-21 DIAGNOSIS — R945 Abnormal results of liver function studies: Secondary | ICD-10-CM | POA: Diagnosis not present

## 2020-11-23 DIAGNOSIS — L570 Actinic keratosis: Secondary | ICD-10-CM | POA: Diagnosis not present

## 2020-12-05 DIAGNOSIS — M47819 Spondylosis without myelopathy or radiculopathy, site unspecified: Secondary | ICD-10-CM | POA: Diagnosis not present

## 2020-12-05 DIAGNOSIS — M5030 Other cervical disc degeneration, unspecified cervical region: Secondary | ICD-10-CM | POA: Diagnosis not present

## 2020-12-05 DIAGNOSIS — I10 Essential (primary) hypertension: Secondary | ICD-10-CM | POA: Diagnosis not present

## 2020-12-05 DIAGNOSIS — E785 Hyperlipidemia, unspecified: Secondary | ICD-10-CM | POA: Diagnosis not present

## 2020-12-06 DIAGNOSIS — I10 Essential (primary) hypertension: Secondary | ICD-10-CM | POA: Diagnosis not present

## 2020-12-06 DIAGNOSIS — E785 Hyperlipidemia, unspecified: Secondary | ICD-10-CM | POA: Diagnosis not present

## 2020-12-07 DIAGNOSIS — R339 Retention of urine, unspecified: Secondary | ICD-10-CM | POA: Diagnosis not present

## 2020-12-07 DIAGNOSIS — N319 Neuromuscular dysfunction of bladder, unspecified: Secondary | ICD-10-CM | POA: Diagnosis not present

## 2020-12-22 DIAGNOSIS — M549 Dorsalgia, unspecified: Secondary | ICD-10-CM | POA: Diagnosis not present

## 2020-12-22 DIAGNOSIS — K5900 Constipation, unspecified: Secondary | ICD-10-CM | POA: Diagnosis not present

## 2020-12-22 DIAGNOSIS — R339 Retention of urine, unspecified: Secondary | ICD-10-CM | POA: Diagnosis not present

## 2020-12-22 DIAGNOSIS — Z79899 Other long term (current) drug therapy: Secondary | ICD-10-CM | POA: Diagnosis not present

## 2020-12-22 DIAGNOSIS — E785 Hyperlipidemia, unspecified: Secondary | ICD-10-CM | POA: Diagnosis not present

## 2020-12-22 DIAGNOSIS — Z6827 Body mass index (BMI) 27.0-27.9, adult: Secondary | ICD-10-CM | POA: Diagnosis not present

## 2020-12-22 DIAGNOSIS — I10 Essential (primary) hypertension: Secondary | ICD-10-CM | POA: Diagnosis not present

## 2020-12-22 DIAGNOSIS — G629 Polyneuropathy, unspecified: Secondary | ICD-10-CM | POA: Diagnosis not present

## 2020-12-23 DIAGNOSIS — M5416 Radiculopathy, lumbar region: Secondary | ICD-10-CM | POA: Diagnosis not present

## 2021-01-04 DIAGNOSIS — E785 Hyperlipidemia, unspecified: Secondary | ICD-10-CM | POA: Diagnosis not present

## 2021-01-04 DIAGNOSIS — I10 Essential (primary) hypertension: Secondary | ICD-10-CM | POA: Diagnosis not present

## 2021-01-05 DIAGNOSIS — M5030 Other cervical disc degeneration, unspecified cervical region: Secondary | ICD-10-CM | POA: Diagnosis not present

## 2021-01-05 DIAGNOSIS — I10 Essential (primary) hypertension: Secondary | ICD-10-CM | POA: Diagnosis not present

## 2021-01-05 DIAGNOSIS — E785 Hyperlipidemia, unspecified: Secondary | ICD-10-CM | POA: Diagnosis not present

## 2021-01-11 NOTE — Progress Notes (Addendum)
PATIENT: Beverly Wolfe DOB: [birth year]  Chief Complaint  Patient presents with  . Follow-up    New rm, alone states, neuropathy is worse       HISTORICAL  Beverly Wolfe is a 70 year old female, seen in request by her primary care physician Dr. Ernestene Kiel, for evaluation of gait abnormality, incontinence, initial evaluation was on February 02, 2020.  She is accompanied by her daughter Beverly Wolfe at visit.  I have reviewed and summarized the referring note from the referring physician.  She had past medical history of hypertension, hyperlipidemia, cervical decompression surgery twice, initial surgery was done at [facility] by orthopedic surgeon Dr. Ellender Hose in 2010, she presented with neck pain, radiating pain to left upper extremity, had a posterior fusion of C3-7, complains of occasionally flareup of neck pain, radiating pain to left upper extremity, arm weakness.  Has been followed up by orthopedic surgeon regularly, had  second cervical decompression of C7-T1 surgery by [facility] orthopedic surgeon Dr. Donivan Scull in November 2020 for progressive right hand weakness, numbness, recurrent neck pain radiating to right upper extremity, post surgery has helped her symptoms, at baseline, she is highly function, ambulate without any difficulty.  She does has residual right hand muscle atrophy, and weakness.  On December 10, 2019, she was able to go shopping at her baseline, unload her car, at 6 PM, she had sudden onset severe left lower extremity pain, starting at left lateral leg, spreading rostrally to involving left lateral thigh and, in the left foot, severe burning sensation lasting for 30 minutes, she was able to walk to her mailbox, 1 hour later, by 7 PM, she had a sudden onset of right lower extremity involvement, in a similar pattern, but seems to be a milder degree, starting from right lateral calf, extending to right thigh, and right foot, she took her hydrocodone rested for a while, while  taking a shower, she noticed difficulty bearing weight, drying her hair, by 8 PM, she had such severe bilateral lower extremity pain, she called her son to bring her to local emergency room, it is difficult for her to sit still due to extreme bilateral lower extremity pain from bilateral hip done, when she is trying to get up changing position, she noticed bilateral lower extremity weakness, difficulty bearing weight  At night of March for 2021 at emergency room of [facility], she got CT lumbar spine, I personally reviewed the film, Multilevel lumbar degenerative changes, there is multifactorial spinal stenosis, most severe at L4-5 followed by L3-4 then L2-3. At L5-S1, there is narrowing of the subarticular lateral recesses and foramina right more than left. Certainly, the findings are severe enough to be symptomatic.  During her emergency room stay in, she develop urinary retention, went to bathroom multiple times without emptying her bladder required cath, she continue complains severe bilateral lower extremity pain, difficulty walking, bilateral lower extremity weakness.  She had MRI on December 11, 2019, I personally reviewed the film, covered T11 through upper sacral region, coronal section cover T12 to lumbar region, multilevel degenerative changes, moderately severe spinal stenosis at L4-5, right lateral recess compression could affect right L5 nerve roots, mild spinal stenosis at L3-4, moderate bilateral facet arthritis at L5-S1.   She underwent posterior decompression with laminectomy for decompression of cauda equina and nerve root at L4-5, L3-4 by Dr. Donivan Scull on December 16, 2019.  From reviewing Dr. Towanda Octave presurgical evaluation, patient was noted to have weakness involving bilateral foot dorsiflexion, and plantarflexion.  Surgery did help her low back pain, which is under reasonable control now, she went to rehab, but still has persistent bilateral leg weakness, especially distal leg, gait  abnormality.  She had a urinary retention at emergency room, now she has urinary incontinence, barely any urge then urinary incontinence without warning signs, when she defecate, she could not feel it come out, she has paresthesia at her saddle area.  UPDATE Mar 02 2020: She is accompanied by her son at today's clinical visit, which showed evidence of chronic bilateral lumbosacral radiculopathy, left worse than right, in addition, there is evidence of upper motor neuron damage, she has decreased activation at the left tibialis posterior, medial gastrocnemius muscle on the left leg, she also has evidence of chronic right cervical radiculopathy.  She complains of significant neuropathic pain despite taking gabapentin 300 mg twice daily, higher dose, she complains of depression, her pain involving bilateral lower extremity, saddle region, previously responded better to Lyrica, tried Cymbalta could not tolerate the side effect of confusion, GI side effect  We also personally reviewed MRI of lumbar spine in March 2021, compared with her repeat MRI lumbar spine in April 2021,  MRI of lumbar spine on December 11, 2019,  covered T11 through upper sacral region, coronal section cover T12 to lumbar region, multilevel degenerative changes, moderately severe spinal stenosis at L4-5, right lateral recess compression could affect right L5 nerve roots, mild spinal stenosis at L3-4, moderate bilateral facet arthritis at L5-S1.  There is also evidence of T2 hyper density lesion on the sagittal, and coronal section, extending from lower T11 to conus medullaris, no contrast enhancement, evidence of lower thoracic swelling, which has improved on repeat MRI lumbar on February 02, 2020  MRI thoracic spine showed no significant canal stenosis, lower thoracic T12 spine signal changes,  MRI of cervical spine showed chronic C3-C7 fusion, no significant canal stenosis.  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 Prince Rome,  UPDATE Jul 14 2020: She reported 70% improvement, she can walk but needs cane, still have numbness at bilateral lower extremity, and the saddle area, on polypharmacy treatment, hydrocodone, Lyrica 200 mg 3 times a day, complains of 4 out of 10 constant burning pain, is tearful during today's visit  I also reviewed [facility] evaluation, laboratory in July 2021, TB was negative, csf Ig G index 0.7,Hepatitis B was negative, HTLV 1 and 2 was negative, CSF showed elevated nucleated cell 44, RBC 63,000, tube 4 03/5874,  [facility] neurology department evaluation of March 10, 2020, atypical for spinal stroke based on radiographic distribution, however she does have vascular risk factor of hypertension, atherosclerosis, and the posterior  surgical embolic process, the spinal cord lesion may also represent immune mediated myelitis, which sometimes may be associated with TB hepatitis, less likely demyelinating although cannot excluded, will also to be evaluated for sarcoidosis, Shogry myelitis,  paraneoplastic,  Work-up so far has included negative ANA, ESR C-reactive protein, HIV, RPR, Lyme, ACE, Normal or negative serum glucose, ENA, ANCA, rheumatoid factor, antiphospholipid panel, HTLV-1, 2 antibody, MO G IgG, MMR IgG, TB quanterferon, hepatitis B hepatitis C antibody,  Could not rule out stroke, continue aspirin 81 mg daily  Patient was seen by third-year neurologist in training Dr. Darrol Angel, with attending physician Dr.Dujmovic-Basuroski  MRA of spinal cord is pending  We also personally reviewed MRI of lumbar spine in August 2021, Sequela of remote conus insult. Faint enhancement of the left S1 nerve root is unchanged and likely reflects postsurgical/inflammatory sequela.  Sequela of posterior lumbar decompression. No significant spinal canal narrowing.  Mild to moderate right L4-5 neural foraminal narrowing, unchanged.  Mild left L1-2, bilateral L2-4, left L4-5 and right L5-S1 neural foraminal narrowing, unchanged.  Conjoined right L5-S1 nerve root, normal anatomic variant.  She no longer has bladder incontinence, alternating between constipation and diarrhea, could not feel the movement  Update January 12, 2021 SS: Released from [facility], determined did have spinal stroke. Is seeing a counselor to discuss chronic pain. Main issue is burning feet, numbness. Currently taking Lyrica 200 mg 3 times daily, Norco 5/325 every 6 hours, Tofranil 25 mg at bedtime. Trileptal caused nausea. Sleeps well, during sleep is only time she doesn't hurt. Was having left sided buttocks pain, Kenalog shot really helped. Not using cane, occasional falls, nothing consistent. Lives alone. On aspirin 81 mg daily. Sees urology, still having urinary retention, back on Flomax, urinates every 2 hours. Feels the left leg has been chronically weaker than the right, can't feel the feet well sometimes injures them. Moving to independent living facility. PCP checked blood work B12, folate, MMA,  CBC. Liver enzymes elevated, US abdomen was normal, will be referred to GI for liver enzyme evaluation. Is doing Designer, fashion/clothing.   09/20/2020 Dr. Kennith Gain [facility]rology [facility]: At this time, highest suspicion remains for spinal stroke. This was my first time comprehensively examining the patient, as previous appointments were virtual. However, based on her history, she has shown significant improvement, and had only mild weakness (5-) in the distal lower extremities. 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 This has been documented on prior neurologic exams, and EMG has been done as well without signs of peripheral neuropathy. Other possibilities include B12 deficiency, folate, MMA. Fortunately she has a primary neurologist closer to home with whom she is following regularly and can complete these labs.    Recommendations as below: from Dr. Lysle Morales Neurology 09/20/2020:   -Continue aspirin 81 mg daily  -START atorvastatin 80 mg daily for secondary stroke prevention; stop Crestor -Continue physical therapy exercises, aquatic therapy, occupational therapy exercises  -Reviewed secondary stroke prevention via lifestyle (diet, exercise, no smoking, etc) -Chronic pain/neuropathic pain regimen per primary neurologist, although encouraged patient to try OTC capsaicin cream (advised to wear gloves when applying, and to put on her feet and distal lower extremities only).  -See a behavioral health specialist (therapist/psychologist/psychiatrist) - patient will turn to her PCP for further assistance/local resources -B12, folate, MMA, CBC (PCP office or primary  neurologist) [facility] Neurology referral had been placed for a second opinion on her clinical presentation and imaging findings. We believe she most likely had a spinal stroke, as mentioned  above. At this point, we think her neurologic follow up can continue with her primary neurologist, Dr. Krista Blue. We are happy to see her again in the future should further concerns arise.  -Follow up with primary neurologist, Dr. Krista Blue, as planned in April 2022  REVIEW OF SYSTEMS: Full 14 system review of systems performed and notable only for as above  All other review of systems were negative.  ALLERGIES: Allergies  Allergen Reactions  . Cymbalta [Duloxetine Hcl] Nausea Only and Other (See Comments)    Shakiness  . Other     Sodium lauryl sulfate - causes mouth sores   . Percocet [Oxycodone-Acetaminophen] Itching  . Talwin [Pentazocine] Nausea And Vomiting and Other (See Comments)    Hallucinations  . Trileptal [Oxcarbazepine] Nausea Only  . Bactrim [Sulfamethoxazole-Trimethoprim] Itching and Rash  . Sulfa Antibiotics Itching and Rash    HOME MEDICATIONS: Current Outpatient Medications  Medication Sig Dispense Refill  . atorvastatin (LIPITOR) 40 MG tablet Take by mouth.    Marland Kitchen aspirin EC 81 MG tablet Take 81 mg by mouth daily. Swallow whole.    Marland Kitchen CALCIUM PO Take 1,000 mg by mouth daily.    . Cholecalciferol (VITAMIN D3) 1000 units CAPS Take by mouth daily.    . Coenzyme Q10 (COQ10 PO) Take 100 mg by mouth daily.    Marland Kitchen HYDROCHLOROTHIAZIDE PO Take 12.5 mg by mouth daily.     Marland Kitchen HYDROcodone-acetaminophen (NORCO/VICODIN) 5-325 MG tablet Take 1 tablet by mouth every 4 (four) hours as needed.    Marland Kitchen imipramine (TOFRANIL) 25 MG tablet Take 1 tablet (25 mg total) by mouth at bedtime. 90 tablet 3  . Lifitegrast (XIIDRA) 5 % SOLN Apply 1 drop to eye daily. Both eyes    . Magnesium 200 MG TABS     . omega-3 acid ethyl esters (LOVAZA) 1 g capsule Take 2 g by mouth 2 (two) times daily.     . polyethylene glycol (MIRALAX / GLYCOLAX) 17 g packet Take 17 g by mouth daily.    . pregabalin (LYRICA) 200 MG capsule Take 200 mg by mouth in the morning, at noon, and at bedtime.    . tamsulosin (FLOMAX) 0.4 MG  CAPS capsule Take 0.8 mg by mouth daily.     No current facility-administered medications for this visit.    PAST MEDICAL HISTORY: Past Medical History:  Diagnosis Date  . Constipation   . High blood pressure   . Hypercholesteremia   . Numbness   . Spinal stenosis   . Urinary incontinence     PAST SURGICAL HISTORY: Past Surgical History:  Procedure Laterality Date  . APPENDECTOMY  03/11/1979  . BREAST BIOPSY Left 08/13/1989  . BREAST BIOPSY Left 10/05/1998  . BUNIONECTOMY  02/04/1986  . CERVICAL FUSION  2010, 2020  . GYNECOLOGIC CRYOSURGERY    . OTHER SURGICAL HISTORY  12/14/2014   Right arm radial nerve release  . POSTERIOR LAMINECTOMY / DECOMPRESSION LUMBAR SPINE     L3-5  . right radial nerve release    . TOE SURGERY Right 01/24/2006  . TUBAL LIGATION  03/11/1979  . ULNAR NERVE TRANSPOSITION Left 08/31/1987  . UMBILICAL HERNIA REPAIR  01/20/2013    FAMILY HISTORY: Family History  Problem Relation Age of Onset  . Allergic rhinitis Mother   . Congestive Heart Failure Mother   .  Emphysema Mother   . Hypertension Mother   . Hypercholesterolemia Mother   . Anxiety disorder Mother   . Cancer - Other Father        Oral Cancer  . Stroke Father   . Benign prostatic hyperplasia Father   . COPD Sister   . Diabetes Maternal Aunt   . Diabetes Maternal Grandmother     SOCIAL HISTORY: Social History   Socioeconomic History  . Marital status: Widowed    Spouse name: Not on file  . Number of children: 2  . Years of education: 14 years  . Highest education level: Not on file  Occupational History  . Occupation: Retired  Tobacco Use  . Smoking status: Never Smoker  . Smokeless tobacco: Never Used  Substance and Sexual Activity  . Alcohol use: Not Currently  . Drug use: Never  . Sexual activity: Not on file  Other Topics Concern  . Not on file  Social History Narrative   Lives at home alone.   Right-handed.   One cup caffeine per day.   Social  Determinants of Health   Financial Resource Strain: Not on file  Food Insecurity: Not on file  Transportation Needs: Not on file  Physical Activity: Not on file  Stress: Not on file  Social Connections: Not on file  Intimate Partner Violence: Not on file   PHYSICAL EXAM   Vitals:   01/12/21 1343  BP: 139/86  Pulse: 81  Weight: 132 lb (59.9 kg)  Height: 5' (1.524 m)   Not recorded     Body mass index is 25.78 kg/m.  PHYSICAL EXAMNIATION:  Gen: NAD, conversant, well nourised, well groomed                     Cardiovascular: Regular rate rhythm, no peripheral edema, warm, nontender. Eyes: Conjunctivae clear without exudates or hemorrhage Neck: Supple, no carotid bruits. Pulmonary: Clear to auscultation bilaterally   NEUROLOGICAL EXAM:  MENTAL STATUS: Speech/cognition: Awake, alert, oriented to history taking and casual conversation  CRANIAL NERVES: CN II: Visual fields are full to confrontation. Pupils are round equal and briskly reactive to light. CN III, IV, VI: extraocular movement are normal. No ptosis. CN V: Facial sensation is intact to light touch CN VII: Face is symmetric with normal eye closure  CN VIII: Hearing is normal to causal conversation. CN IX, X: Phonation is normal. CN XI: Head turning and shoulder shrug are intact  MOTOR: 5/5 strength of bilateral upper extremities  Lower extremities 5/5 muscle strength, 4/5 bilateral dorsiflexion, plantar flexion, trouble bending left toes, right toes are normal  REFLEXES: Reflexes are 2+ and symmetric at the biceps, triceps, 3/3 knees, and absent at ankles.   SENSORY: Length dependent decreased to light touch, vibratory sensation, pinprick to mid shin level  COORDINATION: Good finger-nose-finger and heel-to-shin bilaterally  GAIT/STANCE: She needs push-up to get up from seated position, wide-based unsteady, dragging her left leg more  DIAGNOSTIC DATA (LABS, IMAGING, TESTING) - I reviewed patient  records, labs, notes, testing and imaging myself where available.   ASSESSMENT AND PLAN  COBI ALDAPE is a 70 y.o. female    History of cervical radiculopathy status post cervical decompression surgery in the past, Presented with sudden onset left lower extremity followed by right lower extremity extreme pain and weakness, sensory loss, urinary retention, bowel incontinence, -Status post lumbar L4-5, L3-4 decompression surgery on December 14, 2019. -MRI of lumbar spine showed evidence of thoracic T12, signal changes, in  combination with her history, imaging findings, most suggestive of lower thoracic conus medullary infarction -EMG 2021 showed no evidence of peripheral neuropathy, evidence of chronic bilateral lumbosacral radiculopathy bilateral L4-5 S1 myotomes., and right cervical radiculopathy C5, 678 myotomes. -Continue aspirin 81 mg daily -Reviewed last visit September 20, 2020 with Dr. Lysle Morales Neurology, high suspicion remains for spinal stroke, was referred for counseling, started atorvastatin 80 mg daily for secondary stroke prevention, PCP has checked B12, folate, MMA, CBC (per patient), consider evaluation of heart rhythm abnormalities or structural issues as cause of stroke -Will discuss with Dr. Krista Blue if any further stroke work-up needed such as: Cardiac monitor, echocardiogram -Follow-up in 6 months or sooner if needed with Dr. Krista Blue  Neuropathic pain -Right now manageable with current regimen -Lyrica 200 mg 3 times a day -Norco 5/325 mg every 6 hours for pain management -Tofranil 25 mg at bedtime (didn't increase, having high BP)  Addendum 01/16/21 SS: I called the patient and updated her, hold off on additional testing, will observe for now, keep follow-up in October.   I spent 30 minutes of face-to-face and non-face-to-face time with patient.  This included previsit chart review, lab review, study review, order entry, electronic health record documentation, patient  education.  Evangeline Dakin, DNP  [facility] [address], [place], [place] [address] [phone]

## 2021-01-12 ENCOUNTER — Ambulatory Visit: Payer: PPO | Admitting: Neurology

## 2021-01-12 ENCOUNTER — Other Ambulatory Visit: Payer: Self-pay

## 2021-01-12 ENCOUNTER — Encounter: Payer: Self-pay | Admitting: Neurology

## 2021-01-12 VITALS — BP 139/86 | HR 81 | Ht 60.0 in | Wt 132.0 lb

## 2021-01-12 DIAGNOSIS — M792 Neuralgia and neuritis, unspecified: Secondary | ICD-10-CM | POA: Diagnosis not present

## 2021-01-12 DIAGNOSIS — G9511 Acute infarction of spinal cord (embolic) (nonembolic): Secondary | ICD-10-CM | POA: Diagnosis not present

## 2021-01-12 NOTE — Patient Instructions (Signed)
Continue current medications for now Will review Dr. Kennith Gain records

## 2021-01-16 NOTE — Progress Notes (Signed)
Agree with above plan, will continue to observe patient, revealed evaluation by [facility] neurologist, agreed on spinal cord stroke, vascular risk factor of hyperlipidemia, hypertension, keep aspirin 81 mg daily  Will not initiate any further evaluation at this point, return to clinic for follow-up in October

## 2021-01-26 DIAGNOSIS — N3941 Urge incontinence: Secondary | ICD-10-CM | POA: Diagnosis not present

## 2021-01-26 DIAGNOSIS — R3914 Feeling of incomplete bladder emptying: Secondary | ICD-10-CM | POA: Diagnosis not present

## 2021-01-26 DIAGNOSIS — R350 Frequency of micturition: Secondary | ICD-10-CM | POA: Diagnosis not present

## 2021-01-26 DIAGNOSIS — N130 Hydronephrosis with ureteropelvic junction obstruction: Secondary | ICD-10-CM | POA: Diagnosis not present

## 2021-02-04 DIAGNOSIS — I10 Essential (primary) hypertension: Secondary | ICD-10-CM | POA: Diagnosis not present

## 2021-02-04 DIAGNOSIS — M5030 Other cervical disc degeneration, unspecified cervical region: Secondary | ICD-10-CM | POA: Diagnosis not present

## 2021-02-04 DIAGNOSIS — E785 Hyperlipidemia, unspecified: Secondary | ICD-10-CM | POA: Diagnosis not present

## 2021-02-04 DIAGNOSIS — M47819 Spondylosis without myelopathy or radiculopathy, site unspecified: Secondary | ICD-10-CM | POA: Diagnosis not present

## 2021-02-15 DIAGNOSIS — R339 Retention of urine, unspecified: Secondary | ICD-10-CM | POA: Diagnosis not present

## 2021-02-15 DIAGNOSIS — N319 Neuromuscular dysfunction of bladder, unspecified: Secondary | ICD-10-CM | POA: Diagnosis not present

## 2021-02-17 DIAGNOSIS — N3941 Urge incontinence: Secondary | ICD-10-CM | POA: Diagnosis not present

## 2021-02-17 DIAGNOSIS — R350 Frequency of micturition: Secondary | ICD-10-CM | POA: Diagnosis not present

## 2021-02-17 DIAGNOSIS — N130 Hydronephrosis with ureteropelvic junction obstruction: Secondary | ICD-10-CM | POA: Diagnosis not present

## 2021-02-28 DIAGNOSIS — Z79899 Other long term (current) drug therapy: Secondary | ICD-10-CM | POA: Diagnosis not present

## 2021-02-28 DIAGNOSIS — M4716 Other spondylosis with myelopathy, lumbar region: Secondary | ICD-10-CM | POA: Diagnosis not present

## 2021-02-28 DIAGNOSIS — Z5181 Encounter for therapeutic drug level monitoring: Secondary | ICD-10-CM | POA: Diagnosis not present

## 2021-02-28 DIAGNOSIS — Z6824 Body mass index (BMI) 24.0-24.9, adult: Secondary | ICD-10-CM | POA: Diagnosis not present

## 2021-03-02 DIAGNOSIS — R350 Frequency of micturition: Secondary | ICD-10-CM | POA: Diagnosis not present

## 2021-03-02 DIAGNOSIS — N130 Hydronephrosis with ureteropelvic junction obstruction: Secondary | ICD-10-CM | POA: Diagnosis not present

## 2021-03-02 DIAGNOSIS — N3941 Urge incontinence: Secondary | ICD-10-CM | POA: Diagnosis not present

## 2021-03-07 DIAGNOSIS — M549 Dorsalgia, unspecified: Secondary | ICD-10-CM | POA: Diagnosis not present

## 2021-03-07 DIAGNOSIS — M5030 Other cervical disc degeneration, unspecified cervical region: Secondary | ICD-10-CM | POA: Diagnosis not present

## 2021-03-07 DIAGNOSIS — M5430 Sciatica, unspecified side: Secondary | ICD-10-CM | POA: Diagnosis not present

## 2021-03-07 DIAGNOSIS — E785 Hyperlipidemia, unspecified: Secondary | ICD-10-CM | POA: Diagnosis not present

## 2021-03-07 DIAGNOSIS — I10 Essential (primary) hypertension: Secondary | ICD-10-CM | POA: Diagnosis not present

## 2021-03-14 DIAGNOSIS — K5903 Drug induced constipation: Secondary | ICD-10-CM | POA: Diagnosis not present

## 2021-03-14 DIAGNOSIS — T402X5A Adverse effect of other opioids, initial encounter: Secondary | ICD-10-CM | POA: Diagnosis not present

## 2021-03-14 DIAGNOSIS — R748 Abnormal levels of other serum enzymes: Secondary | ICD-10-CM | POA: Diagnosis not present

## 2021-03-16 DIAGNOSIS — R3914 Feeling of incomplete bladder emptying: Secondary | ICD-10-CM | POA: Diagnosis not present

## 2021-03-16 DIAGNOSIS — N3941 Urge incontinence: Secondary | ICD-10-CM | POA: Diagnosis not present

## 2021-03-16 DIAGNOSIS — S34109D Unspecified injury to unspecified level of lumbar spinal cord, subsequent encounter: Secondary | ICD-10-CM | POA: Diagnosis not present

## 2021-04-06 DIAGNOSIS — I10 Essential (primary) hypertension: Secondary | ICD-10-CM | POA: Diagnosis not present

## 2021-04-06 DIAGNOSIS — R350 Frequency of micturition: Secondary | ICD-10-CM | POA: Diagnosis not present

## 2021-04-06 DIAGNOSIS — E785 Hyperlipidemia, unspecified: Secondary | ICD-10-CM | POA: Diagnosis not present

## 2021-04-06 DIAGNOSIS — N3941 Urge incontinence: Secondary | ICD-10-CM | POA: Diagnosis not present

## 2021-04-06 DIAGNOSIS — R3914 Feeling of incomplete bladder emptying: Secondary | ICD-10-CM | POA: Diagnosis not present

## 2021-04-06 DIAGNOSIS — M5030 Other cervical disc degeneration, unspecified cervical region: Secondary | ICD-10-CM | POA: Diagnosis not present

## 2021-04-06 DIAGNOSIS — S34109D Unspecified injury to unspecified level of lumbar spinal cord, subsequent encounter: Secondary | ICD-10-CM | POA: Diagnosis not present

## 2021-04-21 DIAGNOSIS — R748 Abnormal levels of other serum enzymes: Secondary | ICD-10-CM | POA: Diagnosis not present

## 2021-05-07 DIAGNOSIS — M5030 Other cervical disc degeneration, unspecified cervical region: Secondary | ICD-10-CM | POA: Diagnosis not present

## 2021-05-07 DIAGNOSIS — I10 Essential (primary) hypertension: Secondary | ICD-10-CM | POA: Diagnosis not present

## 2021-05-07 DIAGNOSIS — E785 Hyperlipidemia, unspecified: Secondary | ICD-10-CM | POA: Diagnosis not present

## 2021-06-01 DIAGNOSIS — G629 Polyneuropathy, unspecified: Secondary | ICD-10-CM | POA: Diagnosis not present

## 2021-06-01 DIAGNOSIS — E785 Hyperlipidemia, unspecified: Secondary | ICD-10-CM | POA: Diagnosis not present

## 2021-06-01 DIAGNOSIS — R748 Abnormal levels of other serum enzymes: Secondary | ICD-10-CM | POA: Diagnosis not present

## 2021-06-01 DIAGNOSIS — Z79899 Other long term (current) drug therapy: Secondary | ICD-10-CM | POA: Diagnosis not present

## 2021-06-01 DIAGNOSIS — R233 Spontaneous ecchymoses: Secondary | ICD-10-CM | POA: Diagnosis not present

## 2021-06-01 DIAGNOSIS — I10 Essential (primary) hypertension: Secondary | ICD-10-CM | POA: Diagnosis not present

## 2021-06-01 DIAGNOSIS — Z6823 Body mass index (BMI) 23.0-23.9, adult: Secondary | ICD-10-CM | POA: Diagnosis not present

## 2021-06-01 DIAGNOSIS — R634 Abnormal weight loss: Secondary | ICD-10-CM | POA: Diagnosis not present

## 2021-06-07 DIAGNOSIS — I10 Essential (primary) hypertension: Secondary | ICD-10-CM | POA: Diagnosis not present

## 2021-06-07 DIAGNOSIS — E785 Hyperlipidemia, unspecified: Secondary | ICD-10-CM | POA: Diagnosis not present

## 2021-06-07 DIAGNOSIS — M5030 Other cervical disc degeneration, unspecified cervical region: Secondary | ICD-10-CM | POA: Diagnosis not present

## 2021-07-04 DIAGNOSIS — R748 Abnormal levels of other serum enzymes: Secondary | ICD-10-CM | POA: Diagnosis not present

## 2021-07-07 DIAGNOSIS — E785 Hyperlipidemia, unspecified: Secondary | ICD-10-CM | POA: Diagnosis not present

## 2021-07-07 DIAGNOSIS — M5030 Other cervical disc degeneration, unspecified cervical region: Secondary | ICD-10-CM | POA: Diagnosis not present

## 2021-07-07 DIAGNOSIS — I10 Essential (primary) hypertension: Secondary | ICD-10-CM | POA: Diagnosis not present

## 2021-07-11 DIAGNOSIS — R748 Abnormal levels of other serum enzymes: Secondary | ICD-10-CM | POA: Diagnosis not present

## 2021-07-12 NOTE — Progress Notes (Signed)
Chart reviewed, agree above plan ?

## 2021-07-17 DIAGNOSIS — R7401 Elevation of levels of liver transaminase levels: Secondary | ICD-10-CM | POA: Diagnosis not present

## 2021-07-17 DIAGNOSIS — R748 Abnormal levels of other serum enzymes: Secondary | ICD-10-CM | POA: Diagnosis not present

## 2021-07-22 ENCOUNTER — Other Ambulatory Visit: Payer: Self-pay | Admitting: Neurology

## 2021-07-31 ENCOUNTER — Encounter: Payer: Self-pay | Admitting: Neurology

## 2021-07-31 ENCOUNTER — Ambulatory Visit: Payer: PPO | Admitting: Neurology

## 2021-07-31 VITALS — BP 134/84 | HR 98 | Ht 60.0 in | Wt 129.0 lb

## 2021-07-31 DIAGNOSIS — R269 Unspecified abnormalities of gait and mobility: Secondary | ICD-10-CM

## 2021-07-31 DIAGNOSIS — R32 Unspecified urinary incontinence: Secondary | ICD-10-CM

## 2021-07-31 DIAGNOSIS — M792 Neuralgia and neuritis, unspecified: Secondary | ICD-10-CM | POA: Diagnosis not present

## 2021-07-31 DIAGNOSIS — G9511 Acute infarction of spinal cord (embolic) (nonembolic): Secondary | ICD-10-CM

## 2021-07-31 MED ORDER — PREGABALIN 300 MG PO CAPS: 300.0000 mg | ORAL_CAPSULE | Freq: Three times a day (TID) | ORAL | 4 refills | Status: DC

## 2021-07-31 NOTE — Patient Instructions (Signed)
Meds ordered this encounter  Medications   pregabalin (LYRICA) 300 MG capsule    Sig: Take 1 capsule (300 mg total) by mouth in the morning, at noon, and at bedtime.    Dispense:  270 capsule    Refill:  4

## 2021-07-31 NOTE — Progress Notes (Signed)
ASSESSMENT AND PLAN  Beverly Wolfe is a 70 y.o. female    History of cervical radiculopathy status post cervical decompression surgery in the past, Presented with sudden onset left lower extremity followed by right lower extremity extreme pain and weakness, sensory loss, urinary retention, bowel incontinence in March 2021, -Status post lumbar L4-5, L3-4 decompression surgery on December 14, 2019. 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 -Continue aspirin 81 mg daily Visit with Dr. Lysle Morales Neurology in December 2021, agree with possible stroke at the lower thoracic spine Remain on aspirin 81 mg daily, atorvastatin 40 mg twice a day May consider echocardiogram, ultrasound of carotid artery to address vascular risk factor    Significant bilateral lower extremity neuropathic pain Increase Lyrica Lyrica to 300 mg 3 times a day -Norco 5/325 mg every 6 hours for pain management -Tofranil 25 mg at bedtime EMG nerve conduction study in May 2021   HISTORICAL  Beverly Wolfe is a 70 year old female, seen in request by her primary care physician Dr. Ernestene Kiel, for evaluation of gait abnormality, incontinence, initial evaluation was on February 02, 2020.  She is accompanied by her daughter [name] at visit.  I have reviewed and summarized the referring note from the referring physician.  She had past medical history of hypertension, hyperlipidemia, cervical decompression surgery twice, initial surgery was done at [facility] by orthopedic surgeon Dr. Ellender Hose in 2010, she presented with neck pain, radiating pain to left upper extremity, had a posterior fusion  of C3-7, complains of occasionally flareup of neck pain, radiating pain to left upper extremity, arm weakness.  Has been followed up by orthopedic surgeon regularly, had  second cervical decompression of C7-T1 surgery by [facility] orthopedic surgeon Dr. Donivan Scull in November 2020 for progressive right hand weakness, numbness, recurrent neck pain radiating to right upper extremity, post surgery has helped her symptoms, at baseline, she is highly function, ambulate without any difficulty.  She does has residual right hand muscle atrophy, and weakness.  On December 10, 2019, she was able to go shopping at her baseline, unload her car, at 6 PM, she had sudden onset severe left lower extremity pain, starting at left lateral leg, spreading rostrally to involving left lateral thigh and, in the left foot, severe burning sensation lasting for 30 minutes, she was able to walk to her mailbox, 1 hour later, by 7 PM, she had a sudden onset of right lower extremity involvement, in a similar pattern, but seems to be a milder degree, starting from right lateral calf, extending to right thigh, and right foot, she took her hydrocodone rested for a while, while taking a shower, she noticed difficulty bearing weight, drying her hair, by 8 PM, she had such severe bilateral lower extremity pain, she called her son to bring her to local emergency room, it is difficult for her to sit still due to extreme bilateral lower extremity pain from bilateral hip done, when she is trying to get up changing position, she noticed bilateral lower extremity weakness, difficulty bearing weight  At night of March for 2021 at emergency room of [facility], she got CT lumbar spine, I personally reviewed the film, Multilevel lumbar degenerative changes, there is multifactorial spinal stenosis, most  severe at L4-5 followed by L3-4 then L2-3. At L5-S1, there is narrowing of the subarticular lateral recesses and foramina right more than left. Certainly, the  findings are severe enough to be symptomatic.  During her emergency room stay in, she develop urinary retention, went to bathroom multiple times without emptying her bladder required cath, she continue complains severe bilateral lower extremity pain, difficulty walking, bilateral lower extremity weakness.  She had MRI on December 11, 2019, I personally reviewed the film, covered T11 through upper sacral region, coronal section cover T12 to lumbar region, multilevel degenerative changes, moderately severe spinal stenosis at L4-5, right lateral recess compression could affect right L5 nerve roots, mild spinal stenosis at L3-4, moderate bilateral facet arthritis at L5-S1.   She underwent posterior decompression with laminectomy for decompression of cauda equina and nerve root at L4-5, L3-4 by Dr. Donivan Scull on December 16, 2019.  From reviewing Dr. Towanda Octave presurgical evaluation, patient was noted to have weakness involving bilateral foot dorsiflexion, and plantarflexion.  Surgery did help her low back pain, which is under reasonable control now, she went to rehab, but still has persistent bilateral leg weakness, especially distal leg, gait abnormality.  She had a urinary retention at emergency room, now she has urinary incontinence, barely any urge then urinary incontinence without warning signs, when she defecate, she could not feel it come out, she has paresthesia at her saddle area.  EMG/NCS in May 2021 showed evidence of chronic bilateral lumbosacral radiculopathy, left worse than right, in addition, there is evidence of upper motor neuron damage, she has decreased activation at the left tibialis posterior, medial gastrocnemius muscle on the left leg, she also has evidence of chronic right cervical radiculopathy.  She complains of significant neuropathic pain despite taking gabapentin 300 mg twice daily, higher dose, she complains of depression, her pain involving bilateral lower extremity, saddle region,  previously responded better to Lyrica, tried Cymbalta could not tolerate the side effect of confusion, GI side effect  We also personally reviewed MRI of lumbar spine in March 2021, compared with her repeat MRI lumbar spine in April 2021,  MRI of lumbar spine on December 11, 2019,  covered T11 through upper sacral region, coronal section cover T12 to lumbar region, multilevel degenerative changes, moderately severe spinal stenosis at L4-5, right lateral recess compression could affect right L5 nerve roots, mild spinal stenosis at L3-4, moderate bilateral facet arthritis at L5-S1.  There is also evidence of T2 hyper density lesion on the sagittal, and coronal section, extending from lower T11 to conus medullaris, no contrast enhancement, evidence of lower thoracic swelling, which has improved on repeat MRI lumbar on February 02, 2020  MRI thoracic spine showed no significant canal stenosis, lower thoracic T12 spine signal changes,   MRI of cervical spine showed chronic C3-C7 fusion, no significant canal stenosis.  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 Prince Rome,  UPDATE Jul 14 2020: She reported 70% improvement, she can walk but needs cane, still have numbness at bilateral lower extremity, and the saddle area, on polypharmacy treatment, hydrocodone, Lyrica 200 mg 3 times a day, complains of 4 out of 10 constant burning pain, is tearful during today's visit  [facility] evaluation, laboratory in July 2021, TB was negative, csf Ig G index 0.7,Hepatitis B was negative, HTLV 1 and 2 was negative, CSF showed elevated nucleated cell 44, RBC 63,000, tube 4 03/5874,  [facility] neurology department evaluation of March 10, 2020, atypical for spinal stroke based on radiographic distribution, however she does have vascular risk factor of hypertension, atherosclerosis, and the posterior surgical embolic process, the spinal cord lesion 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 Darrol Angel, with attending physician Dr.Dujmovic-Basuroski  MRA of spinal cord is pending  We also personally reviewed MRI of lumbar spine in August 2021, Sequela of remote conus insult. Faint enhancement of the left S1 nerve root is unchanged and likely reflects postsurgical/inflammatory sequela.  Sequela of posterior lumbar decompression. No significant spinal canal narrowing.  Mild to moderate right L4-5 neural foraminal narrowing, unchanged.  Mild left L1-2, bilateral L2-4,  left L4-5 and right L5-S1 neural foraminal narrowing, unchanged.  Conjoined right L5-S1 nerve root, normal anatomic variant.  She no longer has bladder incontinence, alternating between constipation and diarrhea, could not feel the movement  UPDATE Jul 31 2021: She continues to improve gradually over the past 1 year, was seen by [facility] neurologist in December 2021, highly suspicious for spinal cord stroke, suggested continued to address vascular risk factor hypertension, hyperlipidemia, on aspirin 81 mg daily  Patient reported for a while, she had elevated liver enzyme, Lipitor was on hold for a while, there was no significant change, now back on Lipitor 40 mg twice a day  She continues to have moderate neuropathic pain at lower extremity despite Lyrica 200 mg 3 times a day, previously tried Cymbalta, Trileptal could not tolerated mainly due to GI side effect, also imipramine 25 mg every night, she sleeps very well, Norco 5/325 mg 3 times a day as needed  She has made significant improvement, ambulate without a walker, constipation has improved with fiber supplement, urinary incontinence has improved as well with urologist care, taking Myrbetriq 50 mg every night complains of dry mouth with polypharmacy treatment  REVIEW OF SYSTEMS: Full 14 system review of systems performed and notable only for as above  All other review of systems were negative.  ALLERGIES: Allergies  Allergen Reactions   Cymbalta [Duloxetine Hcl] Nausea Only and Other (See Comments)    Shakiness   Other     Sodium lauryl sulfate - causes mouth sores    Percocet [Oxycodone-Acetaminophen] Itching   Talwin [Pentazocine] Nausea And  Vomiting and Other (See Comments)    Hallucinations   Trileptal [Oxcarbazepine] Nausea Only   Bactrim [Sulfamethoxazole-Trimethoprim] Itching and Rash   Sulfa Antibiotics Itching and Rash    HOME MEDICATIONS: Current Outpatient Medications  Medication Sig Dispense Refill   Alpha  Lipoic Acid-Biotin ER (ALAMAX CR) 600-450 MG-MCG TBCR      aspirin EC 81 MG tablet Take 81 mg by mouth daily. Swallow whole.     atorvastatin (LIPITOR) 40 MG tablet Take by mouth in the morning and at bedtime.     CALCIUM PO Take 1,000 mg by mouth daily.     Cholecalciferol (VITAMIN D3) 1000 units CAPS Take by mouth daily.     Coenzyme Q10 (COQ10 PO) Take 100 mg by mouth daily.     HYDROCHLOROTHIAZIDE PO Take 12.5 mg by mouth daily.      HYDROcodone-acetaminophen (NORCO/VICODIN) 5-325 MG tablet Take 1 tablet by mouth every 4 (four) hours as needed.     imipramine (TOFRANIL) 25 MG tablet TAKE 1 TABLET BY MOUTH AT BEDTIME. 90 tablet 3   Lifitegrast (XIIDRA) 5 % SOLN Apply 1 drop to eye daily. Both eyes     Magnesium 200 MG TABS      MYRBETRIQ 50 MG TB24 tablet Take 50 mg by mouth daily.     omega-3 acid ethyl esters (LOVAZA) 1 g capsule Take 2 g by mouth 2 (two) times daily.      polyethylene glycol (MIRALAX / GLYCOLAX) 17 g packet Take 17 g by mouth daily.     pregabalin (LYRICA) 200 MG capsule Take 200 mg by mouth in the morning, at noon, and at bedtime.     tamsulosin (FLOMAX) 0.4 MG CAPS capsule Take 0.8 mg by mouth daily.     No current facility-administered medications for this visit.    PAST MEDICAL HISTORY: Past Medical History:  Diagnosis Date   Constipation    High blood pressure    Hypercholesteremia    Numbness    Spinal stenosis    Urinary incontinence     PAST SURGICAL HISTORY: Past Surgical History:  Procedure Laterality Date   APPENDECTOMY  03/11/1979   BREAST BIOPSY Left 08/13/1989   BREAST BIOPSY Left 10/05/1998   BUNIONECTOMY  02/04/1986   CERVICAL FUSION  2010, 2020   GYNECOLOGIC CRYOSURGERY     OTHER SURGICAL HISTORY  12/14/2014   Right arm radial nerve release   POSTERIOR LAMINECTOMY / DECOMPRESSION LUMBAR SPINE     L3-5   right radial nerve release     TOE SURGERY Right 01/24/2006   TUBAL LIGATION  03/11/1979   ULNAR NERVE TRANSPOSITION Left  97/98/9211   UMBILICAL HERNIA REPAIR  01/20/2013    FAMILY HISTORY: Family History  Problem Relation Age of Onset   Allergic rhinitis Mother    Congestive Heart Failure Mother    Emphysema Mother    Hypertension Mother    Hypercholesterolemia Mother    Anxiety disorder Mother    Cancer - Other Father        Oral Cancer   Stroke Father    Benign prostatic hyperplasia Father    COPD Sister    Diabetes Maternal Aunt    Diabetes Maternal Grandmother     SOCIAL HISTORY: Social History   Socioeconomic History   Marital status: Widowed    Spouse name: Not on file   Number of children: 2   Years of education: 14 years   Highest education level: Not on file  Occupational History  Occupation: Retired  Tobacco Use   Smoking status: Never   Smokeless tobacco: Never  Substance and Sexual Activity   Alcohol use: Not Currently   Drug use: Never   Sexual activity: Not on file  Other Topics Concern   Not on file  Social History Narrative   Lives at home alone.   Right-handed.   One cup caffeine per day.   Social Determinants of Health   Financial Resource Strain: Not on file  Food Insecurity: Not on file  Transportation Needs: Not on file  Physical Activity: Not on file  Stress: Not on file  Social Connections: Not on file  Intimate Partner Violence: Not on file   PHYSICAL EXAM   Vitals:   07/31/21 1316  BP: 134/84  Pulse: 98  Weight: 129 lb (58.5 kg)  Height: 5' (1.524 m)   Not recorded     Body mass index is 25.19 kg/m.  PHYSICAL EXAMNIATION:  Gen: NAD, conversant, well nourised, well groomed                     Cardiovascular: Regular rate rhythm, no peripheral edema, warm, nontender. Eyes: Conjunctivae clear without exudates or hemorrhage Neck: Supple, no carotid bruits. Pulmonary: Clear to auscultation bilaterally   NEUROLOGICAL EXAM:  MENTAL STATUS: Speech/cognition: Awake, alert, oriented to history taking and casual conversation  CRANIAL  NERVES: CN II: Visual fields are full to confrontation. Pupils are round equal and briskly reactive to light. CN III, IV, VI: extraocular movement are normal. No ptosis. CN V: Facial sensation is intact to light touch CN VII: Face is symmetric with normal eye closure  CN VIII: Hearing is normal to causal conversation. CN IX, X: Phonation is normal. CN XI: Head turning and shoulder shrug are intact  MOTOR: 5/5 strength of bilateral upper extremities  Lower extremities 5/5 muscle strength, 4/5 bilateral dorsiflexion, plantar flexion, trouble bending left toes, right toes are normal  REFLEXES: Reflexes are 2+ and symmetric at the biceps, triceps, 3/3 knees, and absent at ankles.   SENSORY: Length dependent decreased to light touch, vibratory sensation, pinprick to mid shin level  COORDINATION: Good finger-nose-finger and heel-to-shin bilaterally  GAIT/STANCE: She needs push-up to get up from seated position, wide-based unsteady, dragging her left leg more  DIAGNOSTIC DATA (LABS, IMAGING, TESTING) - I reviewed patient records, labs, notes, testing and imaging myself where available.

## 2021-08-02 DIAGNOSIS — N130 Hydronephrosis with ureteropelvic junction obstruction: Secondary | ICD-10-CM | POA: Diagnosis not present

## 2021-08-02 DIAGNOSIS — R350 Frequency of micturition: Secondary | ICD-10-CM | POA: Diagnosis not present

## 2021-08-03 DIAGNOSIS — H524 Presbyopia: Secondary | ICD-10-CM | POA: Diagnosis not present

## 2021-08-03 DIAGNOSIS — H25813 Combined forms of age-related cataract, bilateral: Secondary | ICD-10-CM | POA: Diagnosis not present

## 2021-08-07 DIAGNOSIS — M5030 Other cervical disc degeneration, unspecified cervical region: Secondary | ICD-10-CM | POA: Diagnosis not present

## 2021-08-07 DIAGNOSIS — E785 Hyperlipidemia, unspecified: Secondary | ICD-10-CM | POA: Diagnosis not present

## 2021-08-07 DIAGNOSIS — I10 Essential (primary) hypertension: Secondary | ICD-10-CM | POA: Diagnosis not present

## 2021-08-14 DIAGNOSIS — Z1231 Encounter for screening mammogram for malignant neoplasm of breast: Secondary | ICD-10-CM | POA: Diagnosis not present

## 2021-08-16 DIAGNOSIS — I10 Essential (primary) hypertension: Secondary | ICD-10-CM | POA: Diagnosis not present

## 2021-08-16 DIAGNOSIS — E785 Hyperlipidemia, unspecified: Secondary | ICD-10-CM | POA: Diagnosis not present

## 2021-08-16 DIAGNOSIS — Z0000 Encounter for general adult medical examination without abnormal findings: Secondary | ICD-10-CM | POA: Diagnosis not present

## 2021-08-16 DIAGNOSIS — G9511 Acute infarction of spinal cord (embolic) (nonembolic): Secondary | ICD-10-CM | POA: Diagnosis not present

## 2021-08-16 DIAGNOSIS — M47819 Spondylosis without myelopathy or radiculopathy, site unspecified: Secondary | ICD-10-CM | POA: Diagnosis not present

## 2021-08-16 DIAGNOSIS — Z1331 Encounter for screening for depression: Secondary | ICD-10-CM | POA: Diagnosis not present

## 2021-08-17 DIAGNOSIS — Z79899 Other long term (current) drug therapy: Secondary | ICD-10-CM | POA: Diagnosis not present

## 2021-08-17 DIAGNOSIS — E785 Hyperlipidemia, unspecified: Secondary | ICD-10-CM | POA: Diagnosis not present

## 2021-08-24 DIAGNOSIS — Z01411 Encounter for gynecological examination (general) (routine) with abnormal findings: Secondary | ICD-10-CM | POA: Diagnosis not present

## 2021-09-06 DIAGNOSIS — I10 Essential (primary) hypertension: Secondary | ICD-10-CM | POA: Diagnosis not present

## 2021-09-06 DIAGNOSIS — E785 Hyperlipidemia, unspecified: Secondary | ICD-10-CM | POA: Diagnosis not present

## 2021-09-06 DIAGNOSIS — M5030 Other cervical disc degeneration, unspecified cervical region: Secondary | ICD-10-CM | POA: Diagnosis not present

## 2021-09-26 DIAGNOSIS — Z4689 Encounter for fitting and adjustment of other specified devices: Secondary | ICD-10-CM | POA: Diagnosis not present

## 2021-09-26 DIAGNOSIS — N8110 Cystocele, unspecified: Secondary | ICD-10-CM | POA: Diagnosis not present

## 2021-09-26 DIAGNOSIS — Z01411 Encounter for gynecological examination (general) (routine) with abnormal findings: Secondary | ICD-10-CM | POA: Diagnosis not present

## 2021-10-06 DIAGNOSIS — I10 Essential (primary) hypertension: Secondary | ICD-10-CM | POA: Diagnosis not present

## 2021-10-06 DIAGNOSIS — M5030 Other cervical disc degeneration, unspecified cervical region: Secondary | ICD-10-CM | POA: Diagnosis not present

## 2021-10-06 DIAGNOSIS — E785 Hyperlipidemia, unspecified: Secondary | ICD-10-CM | POA: Diagnosis not present

## 2021-10-27 DIAGNOSIS — Z4689 Encounter for fitting and adjustment of other specified devices: Secondary | ICD-10-CM | POA: Diagnosis not present

## 2021-10-27 DIAGNOSIS — N952 Postmenopausal atrophic vaginitis: Secondary | ICD-10-CM | POA: Diagnosis not present

## 2021-10-27 DIAGNOSIS — R8290 Unspecified abnormal findings in urine: Secondary | ICD-10-CM | POA: Diagnosis not present

## 2021-10-27 DIAGNOSIS — N3281 Overactive bladder: Secondary | ICD-10-CM | POA: Diagnosis not present

## 2021-10-27 DIAGNOSIS — N812 Incomplete uterovaginal prolapse: Secondary | ICD-10-CM | POA: Diagnosis not present

## 2021-10-27 DIAGNOSIS — N816 Rectocele: Secondary | ICD-10-CM | POA: Diagnosis not present

## 2021-10-27 DIAGNOSIS — R3915 Urgency of urination: Secondary | ICD-10-CM | POA: Diagnosis not present

## 2021-11-05 DIAGNOSIS — E785 Hyperlipidemia, unspecified: Secondary | ICD-10-CM | POA: Diagnosis not present

## 2021-11-05 DIAGNOSIS — I10 Essential (primary) hypertension: Secondary | ICD-10-CM | POA: Diagnosis not present

## 2021-11-05 DIAGNOSIS — M5030 Other cervical disc degeneration, unspecified cervical region: Secondary | ICD-10-CM | POA: Diagnosis not present

## 2021-11-07 DIAGNOSIS — D2239 Melanocytic nevi of other parts of face: Secondary | ICD-10-CM | POA: Diagnosis not present

## 2021-11-07 DIAGNOSIS — L814 Other melanin hyperpigmentation: Secondary | ICD-10-CM | POA: Diagnosis not present

## 2021-11-07 DIAGNOSIS — D225 Melanocytic nevi of trunk: Secondary | ICD-10-CM | POA: Diagnosis not present

## 2021-11-07 DIAGNOSIS — L820 Inflamed seborrheic keratosis: Secondary | ICD-10-CM | POA: Diagnosis not present

## 2021-11-07 DIAGNOSIS — L918 Other hypertrophic disorders of the skin: Secondary | ICD-10-CM | POA: Diagnosis not present

## 2021-11-13 ENCOUNTER — Telehealth: Payer: Self-pay | Admitting: *Deleted

## 2021-11-13 NOTE — Telephone Encounter (Signed)
Received approval for pregabalin 300mg , three capsules daily form [employer] ([id number]). Started by fax w/ plan. Request [id number] approved through 11/10/22. Call [employer] at [phone] for any questions about this authorization.

## 2021-11-28 DIAGNOSIS — I10 Essential (primary) hypertension: Secondary | ICD-10-CM | POA: Diagnosis not present

## 2021-11-28 DIAGNOSIS — R300 Dysuria: Secondary | ICD-10-CM | POA: Diagnosis not present

## 2021-11-28 DIAGNOSIS — E785 Hyperlipidemia, unspecified: Secondary | ICD-10-CM | POA: Diagnosis not present

## 2021-11-28 DIAGNOSIS — M47819 Spondylosis without myelopathy or radiculopathy, site unspecified: Secondary | ICD-10-CM | POA: Diagnosis not present

## 2021-11-28 DIAGNOSIS — R2689 Other abnormalities of gait and mobility: Secondary | ICD-10-CM | POA: Diagnosis not present

## 2021-11-28 DIAGNOSIS — Z79899 Other long term (current) drug therapy: Secondary | ICD-10-CM | POA: Diagnosis not present

## 2021-11-28 DIAGNOSIS — Z6826 Body mass index (BMI) 26.0-26.9, adult: Secondary | ICD-10-CM | POA: Diagnosis not present

## 2021-12-05 DIAGNOSIS — M62552 Muscle wasting and atrophy, not elsewhere classified, left thigh: Secondary | ICD-10-CM | POA: Diagnosis not present

## 2021-12-05 DIAGNOSIS — R2689 Other abnormalities of gait and mobility: Secondary | ICD-10-CM | POA: Diagnosis not present

## 2021-12-05 DIAGNOSIS — M5030 Other cervical disc degeneration, unspecified cervical region: Secondary | ICD-10-CM | POA: Diagnosis not present

## 2021-12-05 DIAGNOSIS — E785 Hyperlipidemia, unspecified: Secondary | ICD-10-CM | POA: Diagnosis not present

## 2021-12-05 DIAGNOSIS — I10 Essential (primary) hypertension: Secondary | ICD-10-CM | POA: Diagnosis not present

## 2021-12-18 DIAGNOSIS — R2689 Other abnormalities of gait and mobility: Secondary | ICD-10-CM | POA: Diagnosis not present

## 2021-12-18 DIAGNOSIS — M62552 Muscle wasting and atrophy, not elsewhere classified, left thigh: Secondary | ICD-10-CM | POA: Diagnosis not present

## 2021-12-21 DIAGNOSIS — M62552 Muscle wasting and atrophy, not elsewhere classified, left thigh: Secondary | ICD-10-CM | POA: Diagnosis not present

## 2021-12-21 DIAGNOSIS — R2689 Other abnormalities of gait and mobility: Secondary | ICD-10-CM | POA: Diagnosis not present

## 2021-12-25 DIAGNOSIS — R2689 Other abnormalities of gait and mobility: Secondary | ICD-10-CM | POA: Diagnosis not present

## 2021-12-25 DIAGNOSIS — M62552 Muscle wasting and atrophy, not elsewhere classified, left thigh: Secondary | ICD-10-CM | POA: Diagnosis not present

## 2021-12-28 DIAGNOSIS — R2689 Other abnormalities of gait and mobility: Secondary | ICD-10-CM | POA: Diagnosis not present

## 2021-12-28 DIAGNOSIS — M62552 Muscle wasting and atrophy, not elsewhere classified, left thigh: Secondary | ICD-10-CM | POA: Diagnosis not present

## 2022-01-01 DIAGNOSIS — M62552 Muscle wasting and atrophy, not elsewhere classified, left thigh: Secondary | ICD-10-CM | POA: Diagnosis not present

## 2022-01-01 DIAGNOSIS — R2689 Other abnormalities of gait and mobility: Secondary | ICD-10-CM | POA: Diagnosis not present

## 2022-01-04 DIAGNOSIS — M62552 Muscle wasting and atrophy, not elsewhere classified, left thigh: Secondary | ICD-10-CM | POA: Diagnosis not present

## 2022-01-04 DIAGNOSIS — R2689 Other abnormalities of gait and mobility: Secondary | ICD-10-CM | POA: Diagnosis not present

## 2022-01-08 DIAGNOSIS — R2689 Other abnormalities of gait and mobility: Secondary | ICD-10-CM | POA: Diagnosis not present

## 2022-01-08 DIAGNOSIS — M62552 Muscle wasting and atrophy, not elsewhere classified, left thigh: Secondary | ICD-10-CM | POA: Diagnosis not present

## 2022-01-11 DIAGNOSIS — R2689 Other abnormalities of gait and mobility: Secondary | ICD-10-CM | POA: Diagnosis not present

## 2022-01-11 DIAGNOSIS — M62552 Muscle wasting and atrophy, not elsewhere classified, left thigh: Secondary | ICD-10-CM | POA: Diagnosis not present

## 2022-01-15 DIAGNOSIS — M62552 Muscle wasting and atrophy, not elsewhere classified, left thigh: Secondary | ICD-10-CM | POA: Diagnosis not present

## 2022-01-15 DIAGNOSIS — R2689 Other abnormalities of gait and mobility: Secondary | ICD-10-CM | POA: Diagnosis not present

## 2022-01-18 DIAGNOSIS — J040 Acute laryngitis: Secondary | ICD-10-CM | POA: Diagnosis not present

## 2022-01-29 ENCOUNTER — Encounter: Payer: Self-pay | Admitting: Neurology

## 2022-01-29 ENCOUNTER — Ambulatory Visit: Payer: PPO | Admitting: Neurology

## 2022-01-29 VITALS — BP 120/81 | HR 86 | Ht 60.0 in | Wt 138.0 lb

## 2022-01-29 DIAGNOSIS — M792 Neuralgia and neuritis, unspecified: Secondary | ICD-10-CM | POA: Diagnosis not present

## 2022-01-29 DIAGNOSIS — G9511 Acute infarction of spinal cord (embolic) (nonembolic): Secondary | ICD-10-CM

## 2022-01-29 DIAGNOSIS — R269 Unspecified abnormalities of gait and mobility: Secondary | ICD-10-CM

## 2022-01-29 MED ORDER — PREGABALIN 300 MG PO CAPS: 300.0000 mg | ORAL_CAPSULE | Freq: Three times a day (TID) | ORAL | 4 refills | Status: AC

## 2022-01-29 MED ORDER — IMIPRAMINE HCL 25 MG PO TABS: 25.0000 mg | ORAL_TABLET | Freq: Every day | ORAL | 3 refills | Status: AC

## 2022-01-29 NOTE — Progress Notes (Signed)
? ?ASSESSMENT AND PLAN ? ?Beverly Wolfe is a 71 y.o. female   ? ?History of cervical radiculopathy status post cervical decompression surgery in the past, ?Presented with sudden onset left lower extremity followed by right lower extremity extreme pain and weakness, sensory loss, urinary retention, bowel incontinence in March 2021, ?-Status post lumbar L4-5, L3-4 decompression surgery on December 14, 2019. 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 ?Visit with Dr. Lysle [facility]fe [facility] in December 2021, agree with probable stroke at the lower thoracic spine ?Remain on aspirin 81 mg daily, atorvastatin 40 mg twice a day  ? ?Bilateral lower extremity neuropathic pain ?Doing better with current Lyrica Lyrica to 300 mg 3 times a day ?And Norco 5/325 mg every 8 hours, she is under r pain management ? Imipramine 25 mg every night ? ?Gait abnormality ? Continue physical therapy, suggested bilateral ankle brace ? Will only return to clinic for new issues ? ?DIAGNOSTIC DATA (LABS, IMAGING, TESTING) ?- I reviewed patient records, labs, notes, testing and imaging myself where available. ? ? ?HISTORICAL ? ?Beverly Wolfe is a 71 year old female, seen in request by her primary care physician Dr. Ernestene Wolfe, for evaluation of gait abnormality, incontinence, initial evaluation was on February 02, 2020.  She is accompanied by her daughter Beverly Wolfe at visit. ? ?I have reviewed and summarized the referring note from the referring physician.  She had past medical history of hypertension, hyperlipidemia, cervical decompression surgery twice, initial surgery was done at [facility] by  orthopedic surgeon Dr. Ellender Hose in 2010, she presented with neck pain, radiating pain to left upper extremity, had a posterior fusion of C3-7, complains of occasionally flareup of neck pain, radiating pain to left upper extremity, arm weakness.  Has been followed up by orthopedic surgeon regularly, had  second cervical decompression of C7-T1 surgery by [facility] orthopedic surgeon Dr. Donivan Scull in November 2020 for progressive right hand weakness, numbness, recurrent neck pain radiating to right upper extremity, post surgery has helped her symptoms, at baseline, she is highly function, ambulate without any difficulty.  She does has residual right hand muscle atrophy, and weakness. ? ?On December 10, 2019, she was able to go shopping at her baseline, unload her car, at 6 PM, she had sudden onset severe left lower extremity pain, starting at left lateral leg, spreading rostrally to involving left lateral thigh and, in the left foot, severe burning sensation lasting for 30 minutes, she was able to walk to her mailbox, 1 hour later, by 7 PM, she had a sudden onset of right lower extremity involvement, in a similar pattern, but seems to be a milder degree, starting from right lateral calf, extending to right thigh, and right foot, she took her hydrocodone rested for a while, while taking a shower, she noticed difficulty bearing weight, drying her hair, by 8 PM, she had such severe bilateral lower extremity pain, she called her son to bring her to local emergency room, it is difficult for her to sit still due to extreme bilateral lower extremity pain from bilateral hip done, when she is trying to get up changing position, she noticed bilateral lower extremity weakness, difficulty bearing weight ? ?At night of March for 2021 at emergency room  of [facility], she got CT lumbar spine, I personally reviewed the film, ?Multilevel lumbar degenerative changes, there is multifactorial spinal stenosis, most severe at L4-5 followed by  L3-4 then L2-3. At L5-S1, there is narrowing of the subarticular lateral recesses and foramina right more than left. Certainly, the findings are severe enough to be symptomatic. ? ?During her emergency room stay in, she develop urinary retention, went to bathroom multiple times without emptying her bladder required cath, she continue complains severe bilateral lower extremity pain, difficulty walking, bilateral lower extremity weakness. ? ?She had MRI on December 11, 2019, I personally reviewed the film, covered T11 through upper sacral region, coronal section cover T12 to lumbar region, multilevel degenerative changes, moderately severe spinal stenosis at L4-5, right lateral recess compression could affect right L5 nerve roots, mild spinal stenosis at L3-4, moderate bilateral facet arthritis at L5-S1.  ? ?She underwent posterior decompression with laminectomy for decompression of cauda equina and nerve root at L4-5, L3-4 by Dr. Donivan Scull on December 16, 2019.  From reviewing Dr. Towanda Octave presurgical evaluation, patient was noted to have weakness involving bilateral foot dorsiflexion, and plantarflexion. ? ?Surgery did help her low back pain, which is under reasonable control now, she went to rehab, but still has persistent bilateral leg weakness, especially distal leg, gait abnormality. ? ?She had a urinary retention at emergency room, now she has urinary incontinence, barely any urge then urinary incontinence without warning signs, when she defecate, she could not feel it come out, she has paresthesia at her saddle area. ? ?EMG/NCS in May 2021 showed evidence of chronic bilateral lumbosacral radiculopathy, left worse than right, in addition, there is evidence of upper motor neuron damage, she has decreased activation at the left tibialis posterior, medial gastrocnemius muscle on the left leg, she also has evidence of chronic right cervical radiculopathy. ? ?She complains of significant neuropathic pain despite taking  gabapentin 300 mg twice daily, higher dose, she complains of depression, her pain involving bilateral lower extremity, saddle region, previously responded better to Lyrica, tried Cymbalta could not tolerate the side effect of confusion, GI side effect ? ?We also personally reviewed MRI of lumbar spine in March 2021, compared with her repeat MRI lumbar spine in April 2021, ? ?MRI of lumbar spine on December 11, 2019,  covered T11 through upper sacral region, coronal section cover T12 to lumbar region, multilevel degenerative changes, moderately severe spinal stenosis at L4-5, right lateral recess compression could affect right L5 nerve roots, mild spinal stenosis at L3-4, moderate bilateral facet arthritis at L5-S1.  There is also evidence of T2 hyper density lesion on the sagittal, and coronal section, extending from lower T11 to conus medullaris, no contrast enhancement, evidence of lower thoracic swelling, which has improved on repeat MRI lumbar on February 02, 2020 ? ?MRI thoracic spine showed no significant canal stenosis, lower thoracic T12 spine signal changes, ?  ?MRI of cervical spine showed chronic C3-C7 fusion, no significant canal stenosis.  Moderate right C3 foraminal stenosis, ACDF at C7-T1, T1-T2, ? ?Extensive laboratory evaluation on February 04, 2020 showed normal negative TSH, C-reactive protein, HIV, RPR, B12, ANA, ESR, CBC, CMP, hypercoagulation panel, Lyme titer, ACE ? ?Her symptoms overall has much improved, left leg is weaker than the right side, but she can ambulate without assistant with unsteady gait, continue have urinary urgency, she could not feel her bowel movement, but has the urge ? 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 ?She had a CT angiogram on Feb 09, 2020,There is atherosclerotic changes of the abdominal aorta without evidence of aneurysm, dissection, posterior surgical decompressive change  of the lower lumbar spine, large amount of stool throughout the colon, ? ?I have suggested her start aspirin 325 mg daily, also referred her to [facility] vascular neurologist Dr. Prince Rome, ? ?UPDATE Oct 7

## 2022-01-30 DIAGNOSIS — M62552 Muscle wasting and atrophy, not elsewhere classified, left thigh: Secondary | ICD-10-CM | POA: Diagnosis not present

## 2022-01-30 DIAGNOSIS — R2689 Other abnormalities of gait and mobility: Secondary | ICD-10-CM | POA: Diagnosis not present

## 2022-02-02 DIAGNOSIS — M62552 Muscle wasting and atrophy, not elsewhere classified, left thigh: Secondary | ICD-10-CM | POA: Diagnosis not present

## 2022-02-02 DIAGNOSIS — R2689 Other abnormalities of gait and mobility: Secondary | ICD-10-CM | POA: Diagnosis not present

## 2022-02-04 DIAGNOSIS — I10 Essential (primary) hypertension: Secondary | ICD-10-CM | POA: Diagnosis not present

## 2022-02-04 DIAGNOSIS — M5030 Other cervical disc degeneration, unspecified cervical region: Secondary | ICD-10-CM | POA: Diagnosis not present

## 2022-02-04 DIAGNOSIS — E785 Hyperlipidemia, unspecified: Secondary | ICD-10-CM | POA: Diagnosis not present

## 2022-02-05 DIAGNOSIS — M62552 Muscle wasting and atrophy, not elsewhere classified, left thigh: Secondary | ICD-10-CM | POA: Diagnosis not present

## 2022-02-05 DIAGNOSIS — R2689 Other abnormalities of gait and mobility: Secondary | ICD-10-CM | POA: Diagnosis not present

## 2022-02-08 DIAGNOSIS — R2689 Other abnormalities of gait and mobility: Secondary | ICD-10-CM | POA: Diagnosis not present

## 2022-02-08 DIAGNOSIS — M62552 Muscle wasting and atrophy, not elsewhere classified, left thigh: Secondary | ICD-10-CM | POA: Diagnosis not present

## 2022-02-12 DIAGNOSIS — M62552 Muscle wasting and atrophy, not elsewhere classified, left thigh: Secondary | ICD-10-CM | POA: Diagnosis not present

## 2022-02-12 DIAGNOSIS — R2689 Other abnormalities of gait and mobility: Secondary | ICD-10-CM | POA: Diagnosis not present

## 2022-02-15 DIAGNOSIS — M62552 Muscle wasting and atrophy, not elsewhere classified, left thigh: Secondary | ICD-10-CM | POA: Diagnosis not present

## 2022-02-15 DIAGNOSIS — R2689 Other abnormalities of gait and mobility: Secondary | ICD-10-CM | POA: Diagnosis not present

## 2022-02-19 DIAGNOSIS — H903 Sensorineural hearing loss, bilateral: Secondary | ICD-10-CM | POA: Diagnosis not present

## 2022-02-20 DIAGNOSIS — R2689 Other abnormalities of gait and mobility: Secondary | ICD-10-CM | POA: Diagnosis not present

## 2022-02-20 DIAGNOSIS — M62552 Muscle wasting and atrophy, not elsewhere classified, left thigh: Secondary | ICD-10-CM | POA: Diagnosis not present

## 2022-02-22 DIAGNOSIS — R2689 Other abnormalities of gait and mobility: Secondary | ICD-10-CM | POA: Diagnosis not present

## 2022-02-22 DIAGNOSIS — M62552 Muscle wasting and atrophy, not elsewhere classified, left thigh: Secondary | ICD-10-CM | POA: Diagnosis not present

## 2022-02-26 DIAGNOSIS — H524 Presbyopia: Secondary | ICD-10-CM | POA: Diagnosis not present

## 2022-02-26 DIAGNOSIS — M62552 Muscle wasting and atrophy, not elsewhere classified, left thigh: Secondary | ICD-10-CM | POA: Diagnosis not present

## 2022-02-26 DIAGNOSIS — R2689 Other abnormalities of gait and mobility: Secondary | ICD-10-CM | POA: Diagnosis not present

## 2022-02-26 DIAGNOSIS — H04123 Dry eye syndrome of bilateral lacrimal glands: Secondary | ICD-10-CM | POA: Diagnosis not present

## 2022-03-01 DIAGNOSIS — M62552 Muscle wasting and atrophy, not elsewhere classified, left thigh: Secondary | ICD-10-CM | POA: Diagnosis not present

## 2022-03-01 DIAGNOSIS — R2689 Other abnormalities of gait and mobility: Secondary | ICD-10-CM | POA: Diagnosis not present

## 2022-03-06 DIAGNOSIS — I10 Essential (primary) hypertension: Secondary | ICD-10-CM | POA: Diagnosis not present

## 2022-03-06 DIAGNOSIS — E785 Hyperlipidemia, unspecified: Secondary | ICD-10-CM | POA: Diagnosis not present

## 2022-03-06 DIAGNOSIS — Z6827 Body mass index (BMI) 27.0-27.9, adult: Secondary | ICD-10-CM | POA: Diagnosis not present

## 2022-03-06 DIAGNOSIS — M47819 Spondylosis without myelopathy or radiculopathy, site unspecified: Secondary | ICD-10-CM | POA: Diagnosis not present

## 2022-03-06 DIAGNOSIS — E2839 Other primary ovarian failure: Secondary | ICD-10-CM | POA: Diagnosis not present

## 2022-03-06 DIAGNOSIS — R682 Dry mouth, unspecified: Secondary | ICD-10-CM | POA: Diagnosis not present

## 2022-03-08 DIAGNOSIS — M62552 Muscle wasting and atrophy, not elsewhere classified, left thigh: Secondary | ICD-10-CM | POA: Diagnosis not present

## 2022-03-08 DIAGNOSIS — R2689 Other abnormalities of gait and mobility: Secondary | ICD-10-CM | POA: Diagnosis not present

## 2022-05-04 DIAGNOSIS — N812 Incomplete uterovaginal prolapse: Secondary | ICD-10-CM | POA: Diagnosis not present

## 2022-05-04 DIAGNOSIS — N3281 Overactive bladder: Secondary | ICD-10-CM | POA: Diagnosis not present

## 2022-05-04 DIAGNOSIS — N952 Postmenopausal atrophic vaginitis: Secondary | ICD-10-CM | POA: Diagnosis not present

## 2022-05-04 DIAGNOSIS — R339 Retention of urine, unspecified: Secondary | ICD-10-CM | POA: Diagnosis not present

## 2022-05-17 DIAGNOSIS — E2839 Other primary ovarian failure: Secondary | ICD-10-CM | POA: Diagnosis not present

## 2022-05-17 DIAGNOSIS — Z0389 Encounter for observation for other suspected diseases and conditions ruled out: Secondary | ICD-10-CM | POA: Diagnosis not present

## 2022-06-04 ENCOUNTER — Telehealth: Payer: Self-pay | Admitting: *Deleted

## 2022-06-04 ENCOUNTER — Other Ambulatory Visit: Payer: Self-pay

## 2022-06-04 NOTE — Patient Outreach (Signed)
  Care Coordination   06/04/2022 Name: Beverly Wolfe MRN: [id number] DOB: 1951-05-17   Care Coordination Outreach Attempts:  A second unsuccessful outreach was attempted today to offer the patient with information about available care coordination services as a benefit of their health plan.     Follow Up Plan:  Additional outreach attempts will be made to offer the patient care coordination information and services.   Encounter Outcome:  No Answer  Care Coordination Interventions Activated:  No   Care Coordination Interventions:  No, not indicated    Tomasa Rand, RN, BSN, CEN [facility] [employer] [phone]

## 2022-06-04 NOTE — Patient Outreach (Signed)
  Care Coordination   Initial Visit Note   06/04/2022 Name: KHALIAH BARNICK MRN: [id number] DOB: May 26, 1951  [employer] Brown[employer] is a 71 y.o. year old female who sees Prochnau, Chrys Racer, MD for primary care. I spoke with  [employer] by phone today.  What matters to the patients health and wellness today?  Pt declines Care Coordination services and indicates her AWV is scheduled with PCP for later this year.     Goals Addressed   None     SDOH assessments and interventions completed:  No     Care Coordination Interventions Activated:  No  Care Coordination Interventions:  No, not indicated   Follow up plan: No further intervention required.   Encounter Outcome:  Pt. Refused

## 2022-06-22 DIAGNOSIS — N3281 Overactive bladder: Secondary | ICD-10-CM | POA: Diagnosis not present

## 2022-06-22 DIAGNOSIS — N816 Rectocele: Secondary | ICD-10-CM | POA: Diagnosis not present

## 2022-06-22 DIAGNOSIS — N8111 Cystocele, midline: Secondary | ICD-10-CM | POA: Diagnosis not present

## 2022-06-22 DIAGNOSIS — N812 Incomplete uterovaginal prolapse: Secondary | ICD-10-CM | POA: Diagnosis not present

## 2022-06-22 DIAGNOSIS — N952 Postmenopausal atrophic vaginitis: Secondary | ICD-10-CM | POA: Diagnosis not present

## 2022-06-22 DIAGNOSIS — R3915 Urgency of urination: Secondary | ICD-10-CM | POA: Diagnosis not present

## 2022-06-28 DIAGNOSIS — E785 Hyperlipidemia, unspecified: Secondary | ICD-10-CM | POA: Diagnosis not present

## 2022-06-28 DIAGNOSIS — I10 Essential (primary) hypertension: Secondary | ICD-10-CM | POA: Diagnosis not present

## 2022-06-28 DIAGNOSIS — K5900 Constipation, unspecified: Secondary | ICD-10-CM | POA: Diagnosis not present

## 2022-06-28 DIAGNOSIS — G629 Polyneuropathy, unspecified: Secondary | ICD-10-CM | POA: Diagnosis not present

## 2022-06-28 DIAGNOSIS — Z79899 Other long term (current) drug therapy: Secondary | ICD-10-CM | POA: Diagnosis not present

## 2022-06-28 DIAGNOSIS — Z6826 Body mass index (BMI) 26.0-26.9, adult: Secondary | ICD-10-CM | POA: Diagnosis not present

## 2022-07-17 DIAGNOSIS — H02831 Dermatochalasis of right upper eyelid: Secondary | ICD-10-CM | POA: Diagnosis not present

## 2022-07-17 DIAGNOSIS — H02834 Dermatochalasis of left upper eyelid: Secondary | ICD-10-CM | POA: Diagnosis not present

## 2022-07-18 DIAGNOSIS — M6281 Muscle weakness (generalized): Secondary | ICD-10-CM | POA: Diagnosis not present

## 2022-07-23 DIAGNOSIS — M6281 Muscle weakness (generalized): Secondary | ICD-10-CM | POA: Diagnosis not present

## 2022-07-27 DIAGNOSIS — M6281 Muscle weakness (generalized): Secondary | ICD-10-CM | POA: Diagnosis not present

## 2022-07-30 DIAGNOSIS — M6281 Muscle weakness (generalized): Secondary | ICD-10-CM | POA: Diagnosis not present

## 2022-08-03 DIAGNOSIS — M6281 Muscle weakness (generalized): Secondary | ICD-10-CM | POA: Diagnosis not present

## 2022-08-06 DIAGNOSIS — M6281 Muscle weakness (generalized): Secondary | ICD-10-CM | POA: Diagnosis not present

## 2022-08-07 DIAGNOSIS — E785 Hyperlipidemia, unspecified: Secondary | ICD-10-CM | POA: Diagnosis not present

## 2022-08-07 DIAGNOSIS — M5030 Other cervical disc degeneration, unspecified cervical region: Secondary | ICD-10-CM | POA: Diagnosis not present

## 2022-08-07 DIAGNOSIS — I10 Essential (primary) hypertension: Secondary | ICD-10-CM | POA: Diagnosis not present

## 2022-08-10 DIAGNOSIS — M6281 Muscle weakness (generalized): Secondary | ICD-10-CM | POA: Diagnosis not present

## 2022-08-13 DIAGNOSIS — M6281 Muscle weakness (generalized): Secondary | ICD-10-CM | POA: Diagnosis not present

## 2022-08-17 DIAGNOSIS — M6281 Muscle weakness (generalized): Secondary | ICD-10-CM | POA: Diagnosis not present

## 2022-08-21 DIAGNOSIS — Z1231 Encounter for screening mammogram for malignant neoplasm of breast: Secondary | ICD-10-CM | POA: Diagnosis not present

## 2022-08-28 DIAGNOSIS — Z1331 Encounter for screening for depression: Secondary | ICD-10-CM | POA: Diagnosis not present

## 2022-08-28 DIAGNOSIS — Z0000 Encounter for general adult medical examination without abnormal findings: Secondary | ICD-10-CM | POA: Diagnosis not present

## 2022-08-28 DIAGNOSIS — G9511 Acute infarction of spinal cord (embolic) (nonembolic): Secondary | ICD-10-CM | POA: Diagnosis not present

## 2022-08-28 DIAGNOSIS — G629 Polyneuropathy, unspecified: Secondary | ICD-10-CM | POA: Diagnosis not present

## 2022-08-28 DIAGNOSIS — Z7189 Other specified counseling: Secondary | ICD-10-CM | POA: Diagnosis not present

## 2022-08-28 DIAGNOSIS — E785 Hyperlipidemia, unspecified: Secondary | ICD-10-CM | POA: Diagnosis not present

## 2022-08-28 DIAGNOSIS — Z6826 Body mass index (BMI) 26.0-26.9, adult: Secondary | ICD-10-CM | POA: Diagnosis not present

## 2022-08-28 DIAGNOSIS — M47819 Spondylosis without myelopathy or radiculopathy, site unspecified: Secondary | ICD-10-CM | POA: Diagnosis not present

## 2022-08-28 DIAGNOSIS — I10 Essential (primary) hypertension: Secondary | ICD-10-CM | POA: Diagnosis not present

## 2022-09-21 DIAGNOSIS — N3281 Overactive bladder: Secondary | ICD-10-CM | POA: Diagnosis not present

## 2022-09-21 DIAGNOSIS — N812 Incomplete uterovaginal prolapse: Secondary | ICD-10-CM | POA: Diagnosis not present

## 2022-09-21 DIAGNOSIS — R82998 Other abnormal findings in urine: Secondary | ICD-10-CM | POA: Diagnosis not present

## 2022-09-21 DIAGNOSIS — N318 Other neuromuscular dysfunction of bladder: Secondary | ICD-10-CM | POA: Diagnosis not present

## 2022-09-21 DIAGNOSIS — R338 Other retention of urine: Secondary | ICD-10-CM | POA: Diagnosis not present

## 2022-10-03 DIAGNOSIS — H02834 Dermatochalasis of left upper eyelid: Secondary | ICD-10-CM | POA: Diagnosis not present

## 2022-10-03 DIAGNOSIS — H02831 Dermatochalasis of right upper eyelid: Secondary | ICD-10-CM | POA: Diagnosis not present

## 2022-11-12 DIAGNOSIS — G834 Cauda equina syndrome: Secondary | ICD-10-CM | POA: Diagnosis not present

## 2022-11-12 DIAGNOSIS — K5900 Constipation, unspecified: Secondary | ICD-10-CM | POA: Diagnosis not present

## 2022-11-13 DIAGNOSIS — L814 Other melanin hyperpigmentation: Secondary | ICD-10-CM | POA: Diagnosis not present

## 2022-11-13 DIAGNOSIS — D2239 Melanocytic nevi of other parts of face: Secondary | ICD-10-CM | POA: Diagnosis not present

## 2022-11-13 DIAGNOSIS — L820 Inflamed seborrheic keratosis: Secondary | ICD-10-CM | POA: Diagnosis not present

## 2022-11-13 DIAGNOSIS — L821 Other seborrheic keratosis: Secondary | ICD-10-CM | POA: Diagnosis not present

## 2022-11-13 DIAGNOSIS — D225 Melanocytic nevi of trunk: Secondary | ICD-10-CM | POA: Diagnosis not present

## 2022-11-26 DIAGNOSIS — M6281 Muscle weakness (generalized): Secondary | ICD-10-CM | POA: Diagnosis not present

## 2022-11-29 DIAGNOSIS — I10 Essential (primary) hypertension: Secondary | ICD-10-CM | POA: Diagnosis not present

## 2022-11-29 DIAGNOSIS — K5900 Constipation, unspecified: Secondary | ICD-10-CM | POA: Diagnosis not present

## 2022-11-29 DIAGNOSIS — Z79899 Other long term (current) drug therapy: Secondary | ICD-10-CM | POA: Diagnosis not present

## 2022-11-29 DIAGNOSIS — Z6827 Body mass index (BMI) 27.0-27.9, adult: Secondary | ICD-10-CM | POA: Diagnosis not present

## 2022-11-29 DIAGNOSIS — E785 Hyperlipidemia, unspecified: Secondary | ICD-10-CM | POA: Diagnosis not present

## 2022-11-29 DIAGNOSIS — R32 Unspecified urinary incontinence: Secondary | ICD-10-CM | POA: Diagnosis not present

## 2022-11-29 DIAGNOSIS — R682 Dry mouth, unspecified: Secondary | ICD-10-CM | POA: Diagnosis not present

## 2022-12-11 DIAGNOSIS — I10 Essential (primary) hypertension: Secondary | ICD-10-CM | POA: Diagnosis not present

## 2022-12-11 DIAGNOSIS — Z8673 Personal history of transient ischemic attack (TIA), and cerebral infarction without residual deficits: Secondary | ICD-10-CM | POA: Diagnosis not present

## 2022-12-11 DIAGNOSIS — Z1211 Encounter for screening for malignant neoplasm of colon: Secondary | ICD-10-CM | POA: Diagnosis not present

## 2022-12-17 DIAGNOSIS — M6281 Muscle weakness (generalized): Secondary | ICD-10-CM | POA: Diagnosis not present

## 2022-12-18 DIAGNOSIS — E870 Hyperosmolality and hypernatremia: Secondary | ICD-10-CM | POA: Diagnosis not present

## 2022-12-20 DIAGNOSIS — M6281 Muscle weakness (generalized): Secondary | ICD-10-CM | POA: Diagnosis not present

## 2022-12-21 DIAGNOSIS — N3281 Overactive bladder: Secondary | ICD-10-CM | POA: Diagnosis not present

## 2022-12-21 DIAGNOSIS — K5900 Constipation, unspecified: Secondary | ICD-10-CM | POA: Diagnosis not present

## 2022-12-21 DIAGNOSIS — R350 Frequency of micturition: Secondary | ICD-10-CM | POA: Diagnosis not present

## 2022-12-21 DIAGNOSIS — R3914 Feeling of incomplete bladder emptying: Secondary | ICD-10-CM | POA: Diagnosis not present

## 2022-12-21 DIAGNOSIS — M6289 Other specified disorders of muscle: Secondary | ICD-10-CM | POA: Diagnosis not present

## 2022-12-21 DIAGNOSIS — N812 Incomplete uterovaginal prolapse: Secondary | ICD-10-CM | POA: Diagnosis not present

## 2022-12-24 DIAGNOSIS — M6281 Muscle weakness (generalized): Secondary | ICD-10-CM | POA: Diagnosis not present

## 2022-12-27 DIAGNOSIS — M6281 Muscle weakness (generalized): Secondary | ICD-10-CM | POA: Diagnosis not present

## 2022-12-31 DIAGNOSIS — R509 Fever, unspecified: Secondary | ICD-10-CM | POA: Diagnosis not present

## 2022-12-31 DIAGNOSIS — J040 Acute laryngitis: Secondary | ICD-10-CM | POA: Diagnosis not present

## 2022-12-31 DIAGNOSIS — J069 Acute upper respiratory infection, unspecified: Secondary | ICD-10-CM | POA: Diagnosis not present

## 2022-12-31 DIAGNOSIS — R051 Acute cough: Secondary | ICD-10-CM | POA: Diagnosis not present

## 2023-01-03 DIAGNOSIS — R338 Other retention of urine: Secondary | ICD-10-CM | POA: Diagnosis not present

## 2023-01-03 DIAGNOSIS — N3946 Mixed incontinence: Secondary | ICD-10-CM | POA: Diagnosis not present

## 2023-01-18 DIAGNOSIS — N319 Neuromuscular dysfunction of bladder, unspecified: Secondary | ICD-10-CM | POA: Diagnosis not present

## 2023-01-18 DIAGNOSIS — R3915 Urgency of urination: Secondary | ICD-10-CM | POA: Diagnosis not present

## 2023-01-18 DIAGNOSIS — K5900 Constipation, unspecified: Secondary | ICD-10-CM | POA: Diagnosis not present

## 2023-01-18 DIAGNOSIS — R339 Retention of urine, unspecified: Secondary | ICD-10-CM | POA: Diagnosis not present

## 2023-01-18 DIAGNOSIS — N812 Incomplete uterovaginal prolapse: Secondary | ICD-10-CM | POA: Diagnosis not present

## 2023-01-18 DIAGNOSIS — R350 Frequency of micturition: Secondary | ICD-10-CM | POA: Diagnosis not present

## 2023-01-18 DIAGNOSIS — M6289 Other specified disorders of muscle: Secondary | ICD-10-CM | POA: Diagnosis not present

## 2023-02-27 DIAGNOSIS — M47819 Spondylosis without myelopathy or radiculopathy, site unspecified: Secondary | ICD-10-CM | POA: Diagnosis not present

## 2023-02-27 DIAGNOSIS — I10 Essential (primary) hypertension: Secondary | ICD-10-CM | POA: Diagnosis not present

## 2023-02-27 DIAGNOSIS — E785 Hyperlipidemia, unspecified: Secondary | ICD-10-CM | POA: Diagnosis not present

## 2023-02-27 DIAGNOSIS — R2681 Unsteadiness on feet: Secondary | ICD-10-CM | POA: Diagnosis not present

## 2023-02-27 DIAGNOSIS — Z6826 Body mass index (BMI) 26.0-26.9, adult: Secondary | ICD-10-CM | POA: Diagnosis not present

## 2023-03-06 DIAGNOSIS — R32 Unspecified urinary incontinence: Secondary | ICD-10-CM | POA: Diagnosis not present

## 2023-03-06 DIAGNOSIS — N2889 Other specified disorders of kidney and ureter: Secondary | ICD-10-CM | POA: Diagnosis not present

## 2023-03-12 DIAGNOSIS — Z8669 Personal history of other diseases of the nervous system and sense organs: Secondary | ICD-10-CM | POA: Diagnosis not present

## 2023-03-12 DIAGNOSIS — M2041 Other hammer toe(s) (acquired), right foot: Secondary | ICD-10-CM | POA: Diagnosis not present

## 2023-03-13 DIAGNOSIS — R339 Retention of urine, unspecified: Secondary | ICD-10-CM | POA: Diagnosis not present

## 2023-03-26 DIAGNOSIS — M6281 Muscle weakness (generalized): Secondary | ICD-10-CM | POA: Diagnosis not present

## 2023-03-26 DIAGNOSIS — R269 Unspecified abnormalities of gait and mobility: Secondary | ICD-10-CM | POA: Diagnosis not present

## 2023-04-01 DIAGNOSIS — M6281 Muscle weakness (generalized): Secondary | ICD-10-CM | POA: Diagnosis not present

## 2023-04-01 DIAGNOSIS — R269 Unspecified abnormalities of gait and mobility: Secondary | ICD-10-CM | POA: Diagnosis not present

## 2023-04-03 DIAGNOSIS — R269 Unspecified abnormalities of gait and mobility: Secondary | ICD-10-CM | POA: Diagnosis not present

## 2023-04-03 DIAGNOSIS — M6281 Muscle weakness (generalized): Secondary | ICD-10-CM | POA: Diagnosis not present

## 2023-04-08 DIAGNOSIS — R269 Unspecified abnormalities of gait and mobility: Secondary | ICD-10-CM | POA: Diagnosis not present

## 2023-04-08 DIAGNOSIS — M6281 Muscle weakness (generalized): Secondary | ICD-10-CM | POA: Diagnosis not present

## 2023-04-09 DIAGNOSIS — R269 Unspecified abnormalities of gait and mobility: Secondary | ICD-10-CM | POA: Diagnosis not present

## 2023-04-09 DIAGNOSIS — M6281 Muscle weakness (generalized): Secondary | ICD-10-CM | POA: Diagnosis not present

## 2023-04-12 DIAGNOSIS — R339 Retention of urine, unspecified: Secondary | ICD-10-CM | POA: Diagnosis not present

## 2023-04-15 DIAGNOSIS — M6281 Muscle weakness (generalized): Secondary | ICD-10-CM | POA: Diagnosis not present

## 2023-04-15 DIAGNOSIS — R269 Unspecified abnormalities of gait and mobility: Secondary | ICD-10-CM | POA: Diagnosis not present

## 2023-04-22 DIAGNOSIS — R269 Unspecified abnormalities of gait and mobility: Secondary | ICD-10-CM | POA: Diagnosis not present

## 2023-04-22 DIAGNOSIS — M6281 Muscle weakness (generalized): Secondary | ICD-10-CM | POA: Diagnosis not present

## 2023-04-24 DIAGNOSIS — M6281 Muscle weakness (generalized): Secondary | ICD-10-CM | POA: Diagnosis not present

## 2023-04-24 DIAGNOSIS — R269 Unspecified abnormalities of gait and mobility: Secondary | ICD-10-CM | POA: Diagnosis not present

## 2023-04-29 DIAGNOSIS — R269 Unspecified abnormalities of gait and mobility: Secondary | ICD-10-CM | POA: Diagnosis not present

## 2023-04-29 DIAGNOSIS — M6281 Muscle weakness (generalized): Secondary | ICD-10-CM | POA: Diagnosis not present

## 2023-05-01 DIAGNOSIS — R269 Unspecified abnormalities of gait and mobility: Secondary | ICD-10-CM | POA: Diagnosis not present

## 2023-05-01 DIAGNOSIS — M6281 Muscle weakness (generalized): Secondary | ICD-10-CM | POA: Diagnosis not present

## 2023-05-06 DIAGNOSIS — R269 Unspecified abnormalities of gait and mobility: Secondary | ICD-10-CM | POA: Diagnosis not present

## 2023-05-06 DIAGNOSIS — H25813 Combined forms of age-related cataract, bilateral: Secondary | ICD-10-CM | POA: Diagnosis not present

## 2023-05-06 DIAGNOSIS — M6281 Muscle weakness (generalized): Secondary | ICD-10-CM | POA: Diagnosis not present

## 2023-05-13 DIAGNOSIS — R339 Retention of urine, unspecified: Secondary | ICD-10-CM | POA: Diagnosis not present

## 2023-05-20 DIAGNOSIS — R300 Dysuria: Secondary | ICD-10-CM | POA: Diagnosis not present

## 2023-05-20 DIAGNOSIS — N3091 Cystitis, unspecified with hematuria: Secondary | ICD-10-CM | POA: Diagnosis not present

## 2023-05-29 DIAGNOSIS — H524 Presbyopia: Secondary | ICD-10-CM | POA: Diagnosis not present

## 2023-06-04 DIAGNOSIS — R339 Retention of urine, unspecified: Secondary | ICD-10-CM | POA: Diagnosis not present

## 2023-06-06 DIAGNOSIS — M2041 Other hammer toe(s) (acquired), right foot: Secondary | ICD-10-CM | POA: Diagnosis not present

## 2023-06-06 DIAGNOSIS — E785 Hyperlipidemia, unspecified: Secondary | ICD-10-CM | POA: Diagnosis not present

## 2023-06-06 DIAGNOSIS — Z6826 Body mass index (BMI) 26.0-26.9, adult: Secondary | ICD-10-CM | POA: Diagnosis not present

## 2023-06-06 DIAGNOSIS — Z79899 Other long term (current) drug therapy: Secondary | ICD-10-CM | POA: Diagnosis not present

## 2023-06-06 DIAGNOSIS — I10 Essential (primary) hypertension: Secondary | ICD-10-CM | POA: Diagnosis not present

## 2023-06-06 DIAGNOSIS — M25372 Other instability, left ankle: Secondary | ICD-10-CM | POA: Diagnosis not present

## 2023-06-06 DIAGNOSIS — G629 Polyneuropathy, unspecified: Secondary | ICD-10-CM | POA: Diagnosis not present

## 2023-07-02 DIAGNOSIS — R339 Retention of urine, unspecified: Secondary | ICD-10-CM | POA: Diagnosis not present

## 2023-07-09 DIAGNOSIS — R5383 Other fatigue: Secondary | ICD-10-CM | POA: Diagnosis not present

## 2023-07-09 DIAGNOSIS — R509 Fever, unspecified: Secondary | ICD-10-CM | POA: Diagnosis not present

## 2023-07-09 DIAGNOSIS — N3000 Acute cystitis without hematuria: Secondary | ICD-10-CM | POA: Diagnosis not present

## 2023-07-09 DIAGNOSIS — Z6826 Body mass index (BMI) 26.0-26.9, adult: Secondary | ICD-10-CM | POA: Diagnosis not present

## 2023-07-17 DIAGNOSIS — D649 Anemia, unspecified: Secondary | ICD-10-CM | POA: Diagnosis not present

## 2023-07-17 DIAGNOSIS — R748 Abnormal levels of other serum enzymes: Secondary | ICD-10-CM | POA: Diagnosis not present

## 2023-07-30 DIAGNOSIS — L209 Atopic dermatitis, unspecified: Secondary | ICD-10-CM | POA: Diagnosis not present

## 2023-07-31 DIAGNOSIS — R5383 Other fatigue: Secondary | ICD-10-CM | POA: Diagnosis not present

## 2023-07-31 DIAGNOSIS — Z6826 Body mass index (BMI) 26.0-26.9, adult: Secondary | ICD-10-CM | POA: Diagnosis not present

## 2023-07-31 DIAGNOSIS — F329 Major depressive disorder, single episode, unspecified: Secondary | ICD-10-CM | POA: Diagnosis not present

## 2023-07-31 DIAGNOSIS — M47819 Spondylosis without myelopathy or radiculopathy, site unspecified: Secondary | ICD-10-CM | POA: Diagnosis not present

## 2023-07-31 DIAGNOSIS — B372 Candidiasis of skin and nail: Secondary | ICD-10-CM | POA: Diagnosis not present

## 2023-07-31 DIAGNOSIS — N390 Urinary tract infection, site not specified: Secondary | ICD-10-CM | POA: Diagnosis not present

## 2023-08-01 DIAGNOSIS — M2041 Other hammer toe(s) (acquired), right foot: Secondary | ICD-10-CM | POA: Diagnosis not present

## 2023-08-01 DIAGNOSIS — M79671 Pain in right foot: Secondary | ICD-10-CM | POA: Diagnosis not present

## 2023-08-01 DIAGNOSIS — M25372 Other instability, left ankle: Secondary | ICD-10-CM | POA: Diagnosis not present

## 2023-08-16 DIAGNOSIS — N812 Incomplete uterovaginal prolapse: Secondary | ICD-10-CM | POA: Diagnosis not present

## 2023-08-16 DIAGNOSIS — N319 Neuromuscular dysfunction of bladder, unspecified: Secondary | ICD-10-CM | POA: Diagnosis not present

## 2023-08-16 DIAGNOSIS — N952 Postmenopausal atrophic vaginitis: Secondary | ICD-10-CM | POA: Diagnosis not present

## 2023-08-16 DIAGNOSIS — R339 Retention of urine, unspecified: Secondary | ICD-10-CM | POA: Diagnosis not present

## 2023-08-23 DIAGNOSIS — Z1231 Encounter for screening mammogram for malignant neoplasm of breast: Secondary | ICD-10-CM | POA: Diagnosis not present

## 2023-08-23 DIAGNOSIS — R339 Retention of urine, unspecified: Secondary | ICD-10-CM | POA: Diagnosis not present

## 2023-08-29 DIAGNOSIS — Z1231 Encounter for screening mammogram for malignant neoplasm of breast: Secondary | ICD-10-CM | POA: Diagnosis not present

## 2023-09-09 DIAGNOSIS — Z1331 Encounter for screening for depression: Secondary | ICD-10-CM | POA: Diagnosis not present

## 2023-09-09 DIAGNOSIS — I6529 Occlusion and stenosis of unspecified carotid artery: Secondary | ICD-10-CM | POA: Diagnosis not present

## 2023-09-09 DIAGNOSIS — M47819 Spondylosis without myelopathy or radiculopathy, site unspecified: Secondary | ICD-10-CM | POA: Diagnosis not present

## 2023-09-09 DIAGNOSIS — E785 Hyperlipidemia, unspecified: Secondary | ICD-10-CM | POA: Diagnosis not present

## 2023-09-09 DIAGNOSIS — Z6826 Body mass index (BMI) 26.0-26.9, adult: Secondary | ICD-10-CM | POA: Diagnosis not present

## 2023-09-09 DIAGNOSIS — G9511 Acute infarction of spinal cord (embolic) (nonembolic): Secondary | ICD-10-CM | POA: Diagnosis not present

## 2023-09-09 DIAGNOSIS — G629 Polyneuropathy, unspecified: Secondary | ICD-10-CM | POA: Diagnosis not present

## 2023-09-09 DIAGNOSIS — N3000 Acute cystitis without hematuria: Secondary | ICD-10-CM | POA: Diagnosis not present

## 2023-09-09 DIAGNOSIS — I10 Essential (primary) hypertension: Secondary | ICD-10-CM | POA: Diagnosis not present

## 2023-09-09 DIAGNOSIS — Z1211 Encounter for screening for malignant neoplasm of colon: Secondary | ICD-10-CM | POA: Diagnosis not present

## 2023-09-09 DIAGNOSIS — Z0000 Encounter for general adult medical examination without abnormal findings: Secondary | ICD-10-CM | POA: Diagnosis not present

## 2023-09-09 DIAGNOSIS — Z7189 Other specified counseling: Secondary | ICD-10-CM | POA: Diagnosis not present

## 2023-09-20 DIAGNOSIS — R339 Retention of urine, unspecified: Secondary | ICD-10-CM | POA: Diagnosis not present

## 2023-10-03 DIAGNOSIS — M25372 Other instability, left ankle: Secondary | ICD-10-CM | POA: Diagnosis not present

## 2023-10-03 DIAGNOSIS — M2041 Other hammer toe(s) (acquired), right foot: Secondary | ICD-10-CM | POA: Diagnosis not present

## 2023-10-03 DIAGNOSIS — M21611 Bunion of right foot: Secondary | ICD-10-CM | POA: Diagnosis not present

## 2023-10-22 DIAGNOSIS — R339 Retention of urine, unspecified: Secondary | ICD-10-CM | POA: Diagnosis not present

## 2023-10-27 DIAGNOSIS — R0981 Nasal congestion: Secondary | ICD-10-CM | POA: Diagnosis not present

## 2023-10-27 DIAGNOSIS — R051 Acute cough: Secondary | ICD-10-CM | POA: Diagnosis not present

## 2023-10-27 DIAGNOSIS — R300 Dysuria: Secondary | ICD-10-CM | POA: Diagnosis not present

## 2023-10-27 DIAGNOSIS — J069 Acute upper respiratory infection, unspecified: Secondary | ICD-10-CM | POA: Diagnosis not present

## 2023-11-12 DIAGNOSIS — D225 Melanocytic nevi of trunk: Secondary | ICD-10-CM | POA: Diagnosis not present

## 2023-11-12 DIAGNOSIS — D2239 Melanocytic nevi of other parts of face: Secondary | ICD-10-CM | POA: Diagnosis not present

## 2023-11-12 DIAGNOSIS — L814 Other melanin hyperpigmentation: Secondary | ICD-10-CM | POA: Diagnosis not present

## 2023-11-12 DIAGNOSIS — L570 Actinic keratosis: Secondary | ICD-10-CM | POA: Diagnosis not present

## 2023-11-12 DIAGNOSIS — L820 Inflamed seborrheic keratosis: Secondary | ICD-10-CM | POA: Diagnosis not present

## 2023-11-12 DIAGNOSIS — L821 Other seborrheic keratosis: Secondary | ICD-10-CM | POA: Diagnosis not present

## 2023-11-18 DIAGNOSIS — R339 Retention of urine, unspecified: Secondary | ICD-10-CM | POA: Diagnosis not present

## 2023-11-18 DIAGNOSIS — N319 Neuromuscular dysfunction of bladder, unspecified: Secondary | ICD-10-CM | POA: Diagnosis not present

## 2023-11-18 DIAGNOSIS — N812 Incomplete uterovaginal prolapse: Secondary | ICD-10-CM | POA: Diagnosis not present

## 2023-12-16 DIAGNOSIS — R339 Retention of urine, unspecified: Secondary | ICD-10-CM | POA: Diagnosis not present

## 2023-12-30 DIAGNOSIS — Z6827 Body mass index (BMI) 27.0-27.9, adult: Secondary | ICD-10-CM | POA: Diagnosis not present

## 2023-12-30 DIAGNOSIS — R32 Unspecified urinary incontinence: Secondary | ICD-10-CM | POA: Diagnosis not present

## 2023-12-30 DIAGNOSIS — R509 Fever, unspecified: Secondary | ICD-10-CM | POA: Diagnosis not present

## 2023-12-30 DIAGNOSIS — R051 Acute cough: Secondary | ICD-10-CM | POA: Diagnosis not present

## 2023-12-30 DIAGNOSIS — J180 Bronchopneumonia, unspecified organism: Secondary | ICD-10-CM | POA: Diagnosis not present

## 2024-01-14 DIAGNOSIS — R339 Retention of urine, unspecified: Secondary | ICD-10-CM | POA: Diagnosis not present

## 2024-01-29 DIAGNOSIS — Z6827 Body mass index (BMI) 27.0-27.9, adult: Secondary | ICD-10-CM | POA: Diagnosis not present

## 2024-01-29 DIAGNOSIS — M47819 Spondylosis without myelopathy or radiculopathy, site unspecified: Secondary | ICD-10-CM | POA: Diagnosis not present

## 2024-01-29 DIAGNOSIS — I10 Essential (primary) hypertension: Secondary | ICD-10-CM | POA: Diagnosis not present

## 2024-01-29 DIAGNOSIS — Z79899 Other long term (current) drug therapy: Secondary | ICD-10-CM | POA: Diagnosis not present

## 2024-01-29 DIAGNOSIS — E785 Hyperlipidemia, unspecified: Secondary | ICD-10-CM | POA: Diagnosis not present

## 2024-01-29 DIAGNOSIS — K5900 Constipation, unspecified: Secondary | ICD-10-CM | POA: Diagnosis not present

## 2024-01-29 DIAGNOSIS — G629 Polyneuropathy, unspecified: Secondary | ICD-10-CM | POA: Diagnosis not present

## 2024-02-12 DIAGNOSIS — R339 Retention of urine, unspecified: Secondary | ICD-10-CM | POA: Diagnosis not present

## 2024-03-12 DIAGNOSIS — R339 Retention of urine, unspecified: Secondary | ICD-10-CM | POA: Diagnosis not present

## 2024-03-19 DIAGNOSIS — K5900 Constipation, unspecified: Secondary | ICD-10-CM | POA: Diagnosis not present

## 2024-03-19 DIAGNOSIS — N319 Neuromuscular dysfunction of bladder, unspecified: Secondary | ICD-10-CM | POA: Diagnosis not present

## 2024-03-19 DIAGNOSIS — R339 Retention of urine, unspecified: Secondary | ICD-10-CM | POA: Diagnosis not present

## 2024-03-19 DIAGNOSIS — N812 Incomplete uterovaginal prolapse: Secondary | ICD-10-CM | POA: Diagnosis not present

## 2024-03-23 DIAGNOSIS — Z01818 Encounter for other preprocedural examination: Secondary | ICD-10-CM | POA: Diagnosis not present

## 2024-03-23 DIAGNOSIS — Z01810 Encounter for preprocedural cardiovascular examination: Secondary | ICD-10-CM | POA: Diagnosis not present

## 2024-03-26 DIAGNOSIS — N8111 Cystocele, midline: Secondary | ICD-10-CM | POA: Diagnosis not present

## 2024-03-26 DIAGNOSIS — N812 Incomplete uterovaginal prolapse: Secondary | ICD-10-CM | POA: Diagnosis not present

## 2024-03-26 DIAGNOSIS — R339 Retention of urine, unspecified: Secondary | ICD-10-CM | POA: Diagnosis not present

## 2024-03-26 DIAGNOSIS — N816 Rectocele: Secondary | ICD-10-CM | POA: Diagnosis not present

## 2024-04-13 DIAGNOSIS — R339 Retention of urine, unspecified: Secondary | ICD-10-CM | POA: Diagnosis not present

## 2024-04-29 DIAGNOSIS — M47819 Spondylosis without myelopathy or radiculopathy, site unspecified: Secondary | ICD-10-CM | POA: Diagnosis not present

## 2024-04-29 DIAGNOSIS — R2681 Unsteadiness on feet: Secondary | ICD-10-CM | POA: Diagnosis not present

## 2024-04-29 DIAGNOSIS — I10 Essential (primary) hypertension: Secondary | ICD-10-CM | POA: Diagnosis not present

## 2024-04-29 DIAGNOSIS — G629 Polyneuropathy, unspecified: Secondary | ICD-10-CM | POA: Diagnosis not present

## 2024-04-29 DIAGNOSIS — R32 Unspecified urinary incontinence: Secondary | ICD-10-CM | POA: Diagnosis not present

## 2024-04-29 DIAGNOSIS — E785 Hyperlipidemia, unspecified: Secondary | ICD-10-CM | POA: Diagnosis not present

## 2024-05-05 DIAGNOSIS — R2689 Other abnormalities of gait and mobility: Secondary | ICD-10-CM | POA: Diagnosis not present

## 2024-05-06 DIAGNOSIS — H35361 Drusen (degenerative) of macula, right eye: Secondary | ICD-10-CM | POA: Diagnosis not present

## 2024-05-06 DIAGNOSIS — H25813 Combined forms of age-related cataract, bilateral: Secondary | ICD-10-CM | POA: Diagnosis not present

## 2024-05-06 DIAGNOSIS — H524 Presbyopia: Secondary | ICD-10-CM | POA: Diagnosis not present

## 2024-05-07 DIAGNOSIS — R2689 Other abnormalities of gait and mobility: Secondary | ICD-10-CM | POA: Diagnosis not present

## 2024-05-11 DIAGNOSIS — R2689 Other abnormalities of gait and mobility: Secondary | ICD-10-CM | POA: Diagnosis not present

## 2024-05-12 DIAGNOSIS — R339 Retention of urine, unspecified: Secondary | ICD-10-CM | POA: Diagnosis not present

## 2024-05-13 DIAGNOSIS — R2689 Other abnormalities of gait and mobility: Secondary | ICD-10-CM | POA: Diagnosis not present

## 2024-05-22 DIAGNOSIS — R2689 Other abnormalities of gait and mobility: Secondary | ICD-10-CM | POA: Diagnosis not present

## 2024-05-27 DIAGNOSIS — R2689 Other abnormalities of gait and mobility: Secondary | ICD-10-CM | POA: Diagnosis not present

## 2024-05-29 DIAGNOSIS — R2689 Other abnormalities of gait and mobility: Secondary | ICD-10-CM | POA: Diagnosis not present

## 2024-06-01 DIAGNOSIS — R2689 Other abnormalities of gait and mobility: Secondary | ICD-10-CM | POA: Diagnosis not present

## 2024-06-03 DIAGNOSIS — R2689 Other abnormalities of gait and mobility: Secondary | ICD-10-CM | POA: Diagnosis not present

## 2024-06-09 DIAGNOSIS — R2689 Other abnormalities of gait and mobility: Secondary | ICD-10-CM | POA: Diagnosis not present

## 2024-06-10 DIAGNOSIS — R339 Retention of urine, unspecified: Secondary | ICD-10-CM | POA: Diagnosis not present

## 2024-06-11 DIAGNOSIS — R2689 Other abnormalities of gait and mobility: Secondary | ICD-10-CM | POA: Diagnosis not present

## 2024-06-15 DIAGNOSIS — R2689 Other abnormalities of gait and mobility: Secondary | ICD-10-CM | POA: Diagnosis not present

## 2024-06-17 DIAGNOSIS — R2689 Other abnormalities of gait and mobility: Secondary | ICD-10-CM | POA: Diagnosis not present

## 2024-06-24 DIAGNOSIS — R2689 Other abnormalities of gait and mobility: Secondary | ICD-10-CM | POA: Diagnosis not present

## 2024-06-29 DIAGNOSIS — R2689 Other abnormalities of gait and mobility: Secondary | ICD-10-CM | POA: Diagnosis not present

## 2024-07-02 DIAGNOSIS — R2689 Other abnormalities of gait and mobility: Secondary | ICD-10-CM | POA: Diagnosis not present

## 2024-07-07 DIAGNOSIS — R2689 Other abnormalities of gait and mobility: Secondary | ICD-10-CM | POA: Diagnosis not present

## 2024-07-09 DIAGNOSIS — R339 Retention of urine, unspecified: Secondary | ICD-10-CM | POA: Diagnosis not present

## 2024-07-10 DIAGNOSIS — R2689 Other abnormalities of gait and mobility: Secondary | ICD-10-CM | POA: Diagnosis not present

## 2024-07-14 DIAGNOSIS — R2689 Other abnormalities of gait and mobility: Secondary | ICD-10-CM | POA: Diagnosis not present

## 2024-07-16 DIAGNOSIS — R2689 Other abnormalities of gait and mobility: Secondary | ICD-10-CM | POA: Diagnosis not present

## 2024-07-17 DIAGNOSIS — N812 Incomplete uterovaginal prolapse: Secondary | ICD-10-CM | POA: Diagnosis not present

## 2024-07-17 DIAGNOSIS — N319 Neuromuscular dysfunction of bladder, unspecified: Secondary | ICD-10-CM | POA: Diagnosis not present

## 2024-07-17 DIAGNOSIS — R339 Retention of urine, unspecified: Secondary | ICD-10-CM | POA: Diagnosis not present

## 2024-07-17 DIAGNOSIS — R3915 Urgency of urination: Secondary | ICD-10-CM | POA: Diagnosis not present

## 2024-07-20 DIAGNOSIS — R2689 Other abnormalities of gait and mobility: Secondary | ICD-10-CM | POA: Diagnosis not present

## 2024-07-27 DIAGNOSIS — R2689 Other abnormalities of gait and mobility: Secondary | ICD-10-CM | POA: Diagnosis not present

## 2024-07-30 DIAGNOSIS — I10 Essential (primary) hypertension: Secondary | ICD-10-CM | POA: Diagnosis not present

## 2024-07-30 DIAGNOSIS — Z79899 Other long term (current) drug therapy: Secondary | ICD-10-CM | POA: Diagnosis not present

## 2024-07-30 DIAGNOSIS — E785 Hyperlipidemia, unspecified: Secondary | ICD-10-CM | POA: Diagnosis not present

## 2024-08-11 DIAGNOSIS — R339 Retention of urine, unspecified: Secondary | ICD-10-CM | POA: Diagnosis not present

## 2024-08-21 DIAGNOSIS — N3281 Overactive bladder: Secondary | ICD-10-CM | POA: Diagnosis not present

## 2024-08-21 DIAGNOSIS — N319 Neuromuscular dysfunction of bladder, unspecified: Secondary | ICD-10-CM | POA: Diagnosis not present

## 2024-08-21 DIAGNOSIS — R339 Retention of urine, unspecified: Secondary | ICD-10-CM | POA: Diagnosis not present

## 2024-08-21 DIAGNOSIS — N3941 Urge incontinence: Secondary | ICD-10-CM | POA: Diagnosis not present
